# Patient Record
Sex: Female | Born: 1950 | Race: Black or African American | Hispanic: No | Smoking: Never smoker
Health system: Southern US, Community
[De-identification: ages and names within clinical notes are randomized; demographics above are authoritative.]

## PROBLEM LIST (undated history)

## (undated) DIAGNOSIS — I10 Essential (primary) hypertension: Secondary | ICD-10-CM

## (undated) DIAGNOSIS — E785 Hyperlipidemia, unspecified: Secondary | ICD-10-CM

## (undated) DIAGNOSIS — M1990 Unspecified osteoarthritis, unspecified site: Secondary | ICD-10-CM

## (undated) DIAGNOSIS — M5430 Sciatica, unspecified side: Secondary | ICD-10-CM

## (undated) DIAGNOSIS — I519 Heart disease, unspecified: Secondary | ICD-10-CM

## (undated) HISTORY — DX: Heart disease, unspecified: I51.9

## (undated) HISTORY — PX: ANAL FISSURE REPAIR: SHX2312

## (undated) HISTORY — PX: ECTOPIC PREGNANCY SURGERY: SHX613

## (undated) HISTORY — PX: ABDOMINAL HYSTERECTOMY: SHX81

## (undated) HISTORY — PX: BILATERAL CARPAL TUNNEL RELEASE: SHX6508

---

## 1998-09-12 ENCOUNTER — Emergency Department (HOSPITAL_COMMUNITY): Admission: EM | Admit: 1998-09-12 | Discharge: 1998-09-12 | Payer: Self-pay | Admitting: Emergency Medicine

## 2000-04-20 ENCOUNTER — Encounter: Payer: Self-pay | Admitting: Emergency Medicine

## 2000-04-20 ENCOUNTER — Emergency Department (HOSPITAL_COMMUNITY): Admission: EM | Admit: 2000-04-20 | Discharge: 2000-04-20 | Payer: Self-pay | Admitting: Emergency Medicine

## 2000-08-09 ENCOUNTER — Ambulatory Visit (HOSPITAL_COMMUNITY): Admission: RE | Admit: 2000-08-09 | Discharge: 2000-08-09 | Payer: Self-pay | Admitting: Family Medicine

## 2000-08-09 ENCOUNTER — Encounter: Payer: Self-pay | Admitting: Family Medicine

## 2001-02-07 ENCOUNTER — Emergency Department (HOSPITAL_COMMUNITY): Admission: EM | Admit: 2001-02-07 | Discharge: 2001-02-07 | Payer: Self-pay | Admitting: *Deleted

## 2001-07-01 ENCOUNTER — Emergency Department (HOSPITAL_COMMUNITY): Admission: EM | Admit: 2001-07-01 | Discharge: 2001-07-01 | Payer: Self-pay | Admitting: Emergency Medicine

## 2001-11-16 ENCOUNTER — Emergency Department (HOSPITAL_COMMUNITY): Admission: EM | Admit: 2001-11-16 | Discharge: 2001-11-16 | Payer: Self-pay | Admitting: Emergency Medicine

## 2002-02-18 ENCOUNTER — Encounter: Payer: Self-pay | Admitting: Emergency Medicine

## 2002-02-18 ENCOUNTER — Ambulatory Visit (HOSPITAL_COMMUNITY): Admission: RE | Admit: 2002-02-18 | Discharge: 2002-02-18 | Payer: Self-pay | Admitting: Emergency Medicine

## 2002-03-04 ENCOUNTER — Encounter: Admission: RE | Admit: 2002-03-04 | Discharge: 2002-03-04 | Payer: Self-pay | Admitting: Emergency Medicine

## 2002-03-04 ENCOUNTER — Encounter: Payer: Self-pay | Admitting: Emergency Medicine

## 2002-06-25 ENCOUNTER — Encounter: Payer: Self-pay | Admitting: Emergency Medicine

## 2002-06-25 ENCOUNTER — Emergency Department (HOSPITAL_COMMUNITY): Admission: EM | Admit: 2002-06-25 | Discharge: 2002-06-25 | Payer: Self-pay | Admitting: Emergency Medicine

## 2002-08-26 ENCOUNTER — Other Ambulatory Visit: Admission: RE | Admit: 2002-08-26 | Discharge: 2002-08-26 | Payer: Self-pay | Admitting: Obstetrics and Gynecology

## 2002-09-28 ENCOUNTER — Emergency Department (HOSPITAL_COMMUNITY): Admission: EM | Admit: 2002-09-28 | Discharge: 2002-09-28 | Payer: Self-pay | Admitting: Emergency Medicine

## 2002-11-27 HISTORY — PX: CARDIAC CATHETERIZATION: SHX172

## 2002-12-26 ENCOUNTER — Emergency Department (HOSPITAL_COMMUNITY): Admission: EM | Admit: 2002-12-26 | Discharge: 2002-12-26 | Payer: Self-pay | Admitting: Emergency Medicine

## 2003-02-10 ENCOUNTER — Encounter: Payer: Self-pay | Admitting: Emergency Medicine

## 2003-02-11 ENCOUNTER — Inpatient Hospital Stay (HOSPITAL_COMMUNITY): Admission: EM | Admit: 2003-02-11 | Discharge: 2003-02-12 | Payer: Self-pay | Admitting: Cardiology

## 2003-04-22 ENCOUNTER — Emergency Department (HOSPITAL_COMMUNITY): Admission: EM | Admit: 2003-04-22 | Discharge: 2003-04-22 | Payer: Self-pay | Admitting: Emergency Medicine

## 2003-04-23 ENCOUNTER — Emergency Department (HOSPITAL_COMMUNITY): Admission: EM | Admit: 2003-04-23 | Discharge: 2003-04-24 | Payer: Self-pay | Admitting: Emergency Medicine

## 2003-04-24 ENCOUNTER — Encounter: Payer: Self-pay | Admitting: Emergency Medicine

## 2003-11-27 ENCOUNTER — Emergency Department (HOSPITAL_COMMUNITY): Admission: EM | Admit: 2003-11-27 | Discharge: 2003-11-27 | Payer: Self-pay | Admitting: Emergency Medicine

## 2003-11-30 ENCOUNTER — Ambulatory Visit (HOSPITAL_COMMUNITY): Admission: RE | Admit: 2003-11-30 | Discharge: 2003-11-30 | Payer: Self-pay | Admitting: Obstetrics and Gynecology

## 2004-07-18 ENCOUNTER — Encounter: Admission: RE | Admit: 2004-07-18 | Discharge: 2004-07-18 | Payer: Self-pay | Admitting: Family Medicine

## 2004-08-29 ENCOUNTER — Ambulatory Visit: Payer: Self-pay | Admitting: Family Medicine

## 2005-04-25 ENCOUNTER — Emergency Department (HOSPITAL_COMMUNITY): Admission: EM | Admit: 2005-04-25 | Discharge: 2005-04-25 | Payer: Self-pay | Admitting: Emergency Medicine

## 2005-07-02 ENCOUNTER — Encounter (INDEPENDENT_AMBULATORY_CARE_PROVIDER_SITE_OTHER): Payer: Self-pay | Admitting: *Deleted

## 2005-07-10 ENCOUNTER — Emergency Department (HOSPITAL_COMMUNITY): Admission: EM | Admit: 2005-07-10 | Discharge: 2005-07-11 | Payer: Self-pay | Admitting: Emergency Medicine

## 2005-07-17 ENCOUNTER — Ambulatory Visit: Payer: Self-pay | Admitting: Family Medicine

## 2005-10-02 ENCOUNTER — Ambulatory Visit: Payer: Self-pay | Admitting: Family Medicine

## 2006-01-15 ENCOUNTER — Ambulatory Visit: Payer: Self-pay | Admitting: Family Medicine

## 2006-05-14 ENCOUNTER — Ambulatory Visit (HOSPITAL_COMMUNITY): Admission: RE | Admit: 2006-05-14 | Discharge: 2006-05-14 | Payer: Self-pay | Admitting: Family Medicine

## 2006-05-14 ENCOUNTER — Ambulatory Visit: Payer: Self-pay | Admitting: Family Medicine

## 2006-05-17 ENCOUNTER — Ambulatory Visit (HOSPITAL_COMMUNITY): Admission: RE | Admit: 2006-05-17 | Discharge: 2006-05-17 | Payer: Self-pay | Admitting: Family Medicine

## 2006-07-05 ENCOUNTER — Emergency Department (HOSPITAL_COMMUNITY): Admission: EM | Admit: 2006-07-05 | Discharge: 2006-07-05 | Payer: Self-pay | Admitting: Emergency Medicine

## 2006-11-26 ENCOUNTER — Ambulatory Visit: Payer: Self-pay | Admitting: Family Medicine

## 2006-11-29 ENCOUNTER — Ambulatory Visit: Payer: Self-pay | Admitting: *Deleted

## 2006-12-17 ENCOUNTER — Ambulatory Visit: Payer: Self-pay | Admitting: Family Medicine

## 2007-01-24 DIAGNOSIS — I10 Essential (primary) hypertension: Secondary | ICD-10-CM | POA: Insufficient documentation

## 2007-01-24 DIAGNOSIS — G4700 Insomnia, unspecified: Secondary | ICD-10-CM

## 2007-01-24 DIAGNOSIS — I498 Other specified cardiac arrhythmias: Secondary | ICD-10-CM | POA: Insufficient documentation

## 2007-01-24 DIAGNOSIS — I2510 Atherosclerotic heart disease of native coronary artery without angina pectoris: Secondary | ICD-10-CM | POA: Insufficient documentation

## 2007-01-25 ENCOUNTER — Encounter (INDEPENDENT_AMBULATORY_CARE_PROVIDER_SITE_OTHER): Payer: Self-pay | Admitting: *Deleted

## 2007-02-04 ENCOUNTER — Ambulatory Visit: Payer: Self-pay | Admitting: Family Medicine

## 2007-02-15 ENCOUNTER — Inpatient Hospital Stay (HOSPITAL_COMMUNITY): Admission: EM | Admit: 2007-02-15 | Discharge: 2007-02-16 | Payer: Self-pay | Admitting: Emergency Medicine

## 2007-05-09 ENCOUNTER — Emergency Department (HOSPITAL_COMMUNITY): Admission: EM | Admit: 2007-05-09 | Discharge: 2007-05-09 | Payer: Self-pay | Admitting: Emergency Medicine

## 2007-05-15 ENCOUNTER — Ambulatory Visit: Payer: Self-pay | Admitting: Family Medicine

## 2007-07-25 ENCOUNTER — Ambulatory Visit: Payer: Self-pay | Admitting: Internal Medicine

## 2007-08-12 ENCOUNTER — Ambulatory Visit: Payer: Self-pay | Admitting: Internal Medicine

## 2007-08-12 LAB — CONVERTED CEMR LAB
CO2: 26 meq/L (ref 19–32)
Calcium: 9.5 mg/dL (ref 8.4–10.5)
Chloride: 105 meq/L (ref 96–112)
Cholesterol: 230 mg/dL — ABNORMAL HIGH (ref 0–200)
Glucose, Bld: 86 mg/dL (ref 70–99)
Sodium: 142 meq/L (ref 135–145)
Total Bilirubin: 1.1 mg/dL (ref 0.3–1.2)
Total Protein: 7.4 g/dL (ref 6.0–8.3)
Triglycerides: 109 mg/dL (ref <150)
VLDL: 22 mg/dL (ref 0–40)

## 2007-08-14 ENCOUNTER — Encounter (INDEPENDENT_AMBULATORY_CARE_PROVIDER_SITE_OTHER): Payer: Self-pay | Admitting: *Deleted

## 2007-09-18 ENCOUNTER — Ambulatory Visit (HOSPITAL_COMMUNITY): Admission: RE | Admit: 2007-09-18 | Discharge: 2007-09-18 | Payer: Self-pay | Admitting: Internal Medicine

## 2007-12-30 ENCOUNTER — Ambulatory Visit: Payer: Self-pay | Admitting: Internal Medicine

## 2008-02-10 ENCOUNTER — Encounter (INDEPENDENT_AMBULATORY_CARE_PROVIDER_SITE_OTHER): Payer: Self-pay | Admitting: Family Medicine

## 2008-02-10 ENCOUNTER — Ambulatory Visit: Payer: Self-pay | Admitting: Internal Medicine

## 2008-02-10 LAB — CONVERTED CEMR LAB
AST: 15 units/L (ref 0–37)
Albumin: 4.6 g/dL (ref 3.5–5.2)
BUN: 17 mg/dL (ref 6–23)
CO2: 28 meq/L (ref 19–32)
Calcium: 9.9 mg/dL (ref 8.4–10.5)
Chloride: 106 meq/L (ref 96–112)
Creatinine, Ser: 0.87 mg/dL (ref 0.40–1.20)
Glucose, Bld: 97 mg/dL (ref 70–99)

## 2008-05-04 ENCOUNTER — Encounter: Payer: Self-pay | Admitting: Family Medicine

## 2008-05-04 ENCOUNTER — Ambulatory Visit: Payer: Self-pay | Admitting: Internal Medicine

## 2008-05-04 LAB — CONVERTED CEMR LAB
Basophils Absolute: 0 10*3/uL (ref 0.0–0.1)
Basophils Relative: 1 % (ref 0–1)
Cholesterol: 186 mg/dL (ref 0–200)
Eosinophils Absolute: 0.2 10*3/uL (ref 0.0–0.7)
Eosinophils Relative: 3 % (ref 0–5)
HCT: 42.9 % (ref 36.0–46.0)
HDL: 74 mg/dL (ref >39)
Hemoglobin: 13.4 g/dL (ref 12.0–15.0)
LDL Cholesterol: 99 mg/dL (ref 0–99)
Lymphocytes Relative: 39 % (ref 12–46)
Lymphs Abs: 2.3 10*3/uL (ref 0.7–4.0)
MCHC: 31.2 g/dL (ref 30.0–36.0)
MCV: 92.1 fL (ref 78.0–100.0)
Monocytes Absolute: 0.5 10*3/uL (ref 0.1–1.0)
Monocytes Relative: 9 % (ref 3–12)
Neutro Abs: 2.8 10*3/uL (ref 1.7–7.7)
Neutrophils Relative %: 48 % (ref 43–77)
Platelets: 291 10*3/uL (ref 150–400)
RBC: 4.66 M/uL (ref 3.87–5.11)
RDW: 14.1 % (ref 11.5–15.5)
Total CHOL/HDL Ratio: 2.5
Triglycerides: 64 mg/dL (ref <150)
VLDL: 13 mg/dL (ref 0–40)
WBC: 5.8 10*3/uL (ref 4.0–10.5)

## 2008-05-11 ENCOUNTER — Ambulatory Visit: Payer: Self-pay | Admitting: Internal Medicine

## 2008-06-22 ENCOUNTER — Ambulatory Visit: Payer: Self-pay | Admitting: Family Medicine

## 2008-09-21 ENCOUNTER — Ambulatory Visit: Payer: Self-pay | Admitting: Family Medicine

## 2008-09-21 LAB — CONVERTED CEMR LAB
ALT: 16 units/L (ref 0–35)
Alkaline Phosphatase: 60 units/L (ref 39–117)
CO2: 26 meq/L (ref 19–32)
Creatinine, Ser: 0.87 mg/dL (ref 0.40–1.20)
Rhuematoid fact SerPl-aCnc: <20 intl units/mL (ref 0–20)
Sed Rate: 4 mm/hr (ref 0–22)
Sodium: 141 meq/L (ref 135–145)
Total Bilirubin: 0.7 mg/dL (ref 0.3–1.2)
Total Protein: 7.1 g/dL (ref 6.0–8.3)
Uric Acid, Serum: 6.1 mg/dL (ref 2.4–7.0)

## 2008-09-22 ENCOUNTER — Ambulatory Visit (HOSPITAL_COMMUNITY): Admission: RE | Admit: 2008-09-22 | Discharge: 2008-09-22 | Payer: Self-pay | Admitting: Family Medicine

## 2008-09-28 ENCOUNTER — Ambulatory Visit: Payer: Self-pay | Admitting: Family Medicine

## 2009-02-08 ENCOUNTER — Ambulatory Visit: Payer: Self-pay | Admitting: Family Medicine

## 2009-03-04 ENCOUNTER — Emergency Department (HOSPITAL_COMMUNITY): Admission: EM | Admit: 2009-03-04 | Discharge: 2009-03-04 | Payer: Self-pay | Admitting: Emergency Medicine

## 2009-05-24 ENCOUNTER — Ambulatory Visit: Payer: Self-pay | Admitting: Internal Medicine

## 2009-06-23 ENCOUNTER — Ambulatory Visit: Payer: Self-pay | Admitting: Internal Medicine

## 2009-09-27 ENCOUNTER — Ambulatory Visit (HOSPITAL_COMMUNITY): Admission: RE | Admit: 2009-09-27 | Discharge: 2009-09-27 | Payer: Self-pay | Admitting: Family Medicine

## 2009-10-04 ENCOUNTER — Ambulatory Visit: Payer: Self-pay | Admitting: Internal Medicine

## 2009-10-25 ENCOUNTER — Encounter: Admission: RE | Admit: 2009-10-25 | Discharge: 2009-10-25 | Payer: Self-pay | Admitting: Chiropractic Medicine

## 2010-02-21 ENCOUNTER — Ambulatory Visit: Payer: Self-pay | Admitting: Family Medicine

## 2010-03-21 ENCOUNTER — Ambulatory Visit: Payer: Self-pay | Admitting: Internal Medicine

## 2010-03-21 LAB — CONVERTED CEMR LAB
CO2: 26 meq/L (ref 19–32)
Chloride: 105 meq/L (ref 96–112)
Creatinine, Ser: 0.88 mg/dL (ref 0.40–1.20)
HDL: 69 mg/dL (ref >39)
Hgb A1c MFr Bld: 6.1 % — ABNORMAL HIGH (ref <5.7)
LDL Cholesterol: 94 mg/dL (ref 0–99)
Potassium: 4.5 meq/L (ref 3.5–5.3)
Triglycerides: 80 mg/dL (ref <150)
VLDL: 16 mg/dL (ref 0–40)

## 2010-03-28 ENCOUNTER — Ambulatory Visit: Payer: Self-pay | Admitting: Internal Medicine

## 2010-05-23 ENCOUNTER — Ambulatory Visit: Payer: Self-pay | Admitting: Internal Medicine

## 2010-08-11 ENCOUNTER — Emergency Department (HOSPITAL_COMMUNITY): Admission: EM | Admit: 2010-08-11 | Discharge: 2010-08-11 | Payer: Self-pay | Admitting: Emergency Medicine

## 2010-10-03 ENCOUNTER — Encounter: Payer: Self-pay | Admitting: Family Medicine

## 2010-10-03 LAB — CONVERTED CEMR LAB
Basophils Absolute: 0 10*3/uL (ref 0.0–0.1)
Basophils Relative: 1 % (ref 0–1)
Eosinophils Absolute: 0.2 10*3/uL (ref 0.0–0.7)
Eosinophils Relative: 3 % (ref 0–5)
HCT: 44.2 % (ref 36.0–46.0)
Hemoglobin: 14 g/dL (ref 12.0–15.0)
MCHC: 31.7 g/dL (ref 30.0–36.0)
MCV: 92.3 fL (ref 78.0–100.0)
Monocytes Absolute: 0.7 10*3/uL (ref 0.1–1.0)
Platelets: 288 10*3/uL (ref 150–400)
RDW: 13.9 % (ref 11.5–15.5)
Sed Rate: 2 mm/hr (ref 0–22)

## 2010-10-14 ENCOUNTER — Ambulatory Visit (HOSPITAL_COMMUNITY)
Admission: RE | Admit: 2010-10-14 | Discharge: 2010-10-14 | Payer: Self-pay | Source: Home / Self Care | Admitting: Internal Medicine

## 2010-12-05 ENCOUNTER — Ambulatory Visit (HOSPITAL_COMMUNITY)
Admission: RE | Admit: 2010-12-05 | Discharge: 2010-12-05 | Payer: Self-pay | Source: Home / Self Care | Attending: Internal Medicine | Admitting: Internal Medicine

## 2011-04-14 NOTE — H&P (Signed)
NAME:  Krystal Gates, Krystal Gates         ACCOUNT NO.:  [id number]   MEDICAL RECORD NO.:  [id number]          PATIENT TYPE:  EMS   LOCATION:  ED                           FACILITY:  [facility]   PHYSICIAN:  Wilson Singer, M.D.DATE OF BIRTH:  12-12-1950   DATE OF ADMISSION:  02/15/2007  DATE OF DISCHARGE:                              HISTORY & PHYSICAL   HISTORY:  This is a 60 year old lady who woke up this morning at 3 a.m.  when her alarm went off in order to go to work. She went from the  bedside to turn off her alarm and promptly lost consciousness. She  denies any dizziness or light-headedness prior to passing out or any  chest pain or palpitations. The son who lives with her heard her fall  and came up and saw her motionless on the floor. She was not shaking her  limbs. There was no urinary or fecal incontinence. He called the 911  service and by the time they came at about 3:30 in the morning she was  regaining consciousness. She was somewhat confused but now she feels  back to normal. She is completely alert and orientated to time, place  and person. She does have a headache which is typical of  migraine  headaches that she has had in the past.   PAST MEDICAL HISTORY:  1. Hypertension.  2. Hyperlipidemia.  3. She has had a history of chest pain in 2004 when she had a cardiac      catheterization which showed an ejection fraction of 50% with a 30-      40% stenosis at the ostial portion of the left anterior descending      artery but otherwise the cardiac catheterization was normal and      medical management was advised. On this occasion, she was admitted      with chest pain and myocardial infarction was ruled out.   MEDICATIONS:  1. Lisinopril 10/12.5 one tablet daily.  2. Pepcid 20 mg once a day.  3. Pravastatin 20 mg b.i.d.  4. She also takes some sort of hypnotic, the name of which she is      unclear about.   ALLERGIES:  CODEINE, PENICILLIN, TALWIN, TEQUIN, TYLENOL,  CODEINE #3,  VICODIN, ZITHROMAX.   SOCIAL HISTORY:  She is a widow and lives with her son. She does not  smoke and does not drink alcohol. She works at a [employer].   FAMILY HISTORY:  Noncontributory.   REVIEW OF SYSTEMS:  Apart from the symptoms mentioned above, there are  no other symptoms with all systems reviewed.   PHYSICAL EXAMINATION:  VITAL SIGNS:  Interestingly blood pressure is  116/63 lying and 98/66 standing. Temperature 98.4, pulse 60, respiratory  rate 12 per minute, saturation 100% on 2 liters of oxygen.  CARDIOVASCULAR:  Heart sounds are present and normal with no murmurs or  added sounds. There were no carotid bruits. There was no gallop rhythm.  RESPIRATORY:  Lung fields are clear to auscultation.  ABDOMEN:  Soft and nontender with no hepatosplenomegaly.  NEUROLOGIC:  She is alert and oriented to time,  place to person. There  were no focal neurological signs.   LABORATORY DATA:  White blood cell count 5.1, hemoglobin 13.1, platelets  260, sodium 141, potassium 3.8, chloride 105, bicarb 929, glucose 94,  BUN 15, creatinine 0.89.   Electrocardiogram  done in the emergency room shows normal sinus rhythm  with a sinus bradycardia of 44. There were no acute ST-T wave changes.  CT head scan apparently was done and I do not see the full report but  verbally it has been read as apparently normal with no acute changes.   IMPRESSION:  1. Syncope, questionable etiology. She does have some orthostatic      hypotension in the emergency room.  2. History of hypertension.  3. Hyperlipidemia.  4. Remote history of coronary artery disease asymptomatic.   PLAN:  1. We will admit her to a telemetry bed and do serial cardiac enzymes      and electrocardiograms.  2. Intravenous fluids to improve her hypotension possibly on the basis      of hypovolemia.  3. We will get an MRI of the brain. I have told her that if all      investigations turn to be essentially  unremarkable and she feels      better, she should be able to be discharged home within 24 hours      but further recommendations will depend on her hospital progress.      Wilson Singer, M.D.  Electronically Signed     NCG/MEDQ  D:  02/15/2007  T:  02/15/2007  Job:  [id number]   cc:   Willis Modena, MD

## 2011-04-14 NOTE — Consult Note (Signed)
NAMEECHO, PROPP                     ACCOUNT NO.:  [id number]   MEDICAL RECORD NO.:  [id number]                   PATIENT TYPE:  OBV   LOCATION:  0377                                 FACILITY:  [facility]   PHYSICIAN:  Armanda Magic, M.D.                  DATE OF BIRTH:  1951/01/04   DATE OF CONSULTATION:  02/11/2003  DATE OF DISCHARGE:                                   CONSULTATION   CARDIOLOGY CONSULTATION:   CHIEF COMPLAINT:  Chest pain.   HISTORY OF PRESENT ILLNESS:  The patient is a 60 year old black female with  no previous cardiac history who was admitted with chest pain.  She was at  church practicing dance moves yesterday when she developed sternal chest  pain over her left chest lasting approximately 2 hours and 10/10 in  intensity.  The patient became profoundly diaphoretic, short of breath and  dizzy.  She had presyncope, according to the patient, although she says her  family says that she went completely out for a few seconds.  There was no  radiation of her chest pain.  The pain resolved in the emergency room.  This  morning she had recurrent chest pain when up moving around the room,  currently pain-free.   PAST MEDICAL HISTORY:  Borderline hypertension.   ALLERGIES:  PENICILLIN, CODEINE, TYLOX, TALWIN, ZITHROMAX.   PAST SURGICAL HISTORY:  Ectopic tubal pregnancy, total abdominal  hysterectomy, rectal surgery for fissure and carpal tunnel surgery release.   SOCIAL HISTORY:  She is widowed and she is adopted.  She has three children.  She denies tobacco or alcohol use.   FAMILY HISTORY:  Her father and her mother are both deceased, again the  patient is adopted.   REVIEW OF SYSTEMS:  She had a headache yesterday but otherwise review of  systems is negative except what is stated in HPI.   PHYSICAL EXAMINATION:  VITAL SIGNS:  Blood pressure is 144/76, heart rate  52, she is afebrile.  GENERAL:  Well-developed, well-nourished black female in no acute  distress.  HEENT:  Benign.  NECK:  Supple without lymphadenopathy; no bruits.  LUNGS:  Clear to auscultation throughout.  HEART:  Regular rate and rhythm; no murmurs or acute gallops; normal S1 and  S2.  ABDOMEN:  Soft, nontender, nondistended with active bowel sounds; no  hepatosplenomegaly.  EXTREMITIES:  No cyanosis, erythema, or edema.   LABORATORIES:  Sodium 140, potassium 3.5, chloride 109, bicarb 27, BUN 12,  creatinine 0.9, glucose 95.  White cell count 6.7, hematocrit 37.8,  hemoglobin 12.6, platelet count 278.  INR 1.1.  CPK is 108 and 76, MB is 1.8  and 1.4, troponin less than 0.01 x2.  EKG shows normal sinus rhythm, no  ischemia.    ASSESSMENT:  1. Unstable angina pectoris.  The patient did rule out for myocardial     infarction and the EKG shows no ischemia.  The patient had  recurrent     chest pain this morning.  2. Borderline hypertension.   PLAN:  Cardiac catheterization later today by Dr. Katrinka Blazing, clear liquids now  then n.p.o., check fasting lipid panel, increase beta blockers as needed for  hypertension and give K-Dur 40 mEq now for a potassium of 3.5.  Risks and  benefits of cardiac catheterization were explained including the risk of MI,  CVA, death, bleeding complications, IV contrast allergy, renal failure,  pseudoaneurysm, AV fistula.  The patient understands and wishes to proceed.                                               Armanda Magic, M.D.    TT/MEDQ  D:  02/11/2003  T:  02/11/2003  Job:  [id number]   cc:   Lazaro Arms, M.D.  [address] [place], [place] [address]  Fax: [phone]   Bryan Lemma. Manus Gunning, M.D.  [address] [place]  [place] [address]  Fax: [phone]

## 2011-04-14 NOTE — Cardiovascular Report (Signed)
Krystal Gates, Krystal Gates                     ACCOUNT NO.:  [id number]   MEDICAL RECORD NO.:  [id number]                   PATIENT TYPE:  INP   LOCATION:  2023                                 FACILITY:  [facility]   PHYSICIAN:  Lesleigh Noe, M.D.            DATE OF BIRTH:  Aug 04, 1951   DATE OF PROCEDURE:  02/12/2003  DATE OF DISCHARGE:  02/12/2003                              CARDIAC CATHETERIZATION   PROCEDURES:  1. Left heart catheterization.  2. Selective coronary angiography.  3. Left ventriculography.   INDICATIONS:  Chest pain consistent with ischemic pain, negative enzymes.  The patient has risk factors of coronary disease including hyperlipidemia  and hypertension.   DESCRIPTION OF PROCEDURE:  After informed consent, a 6 French sheath was  placed in the right femoral artery using modified Seldinger technique.  A 6  French pigtail multipurpose catheter was used for hemodynamic purposes, left  ventriculography, and attempted coronary angiography.  A #4 6 French right  and #4 6 French left catheter were also used for coronary angiography.  The  patient tolerated the procedure without complications.   RESULTS:  1. HEMODYNAMIC DATA:     a. Aortic pressure 151/82.     b. Left ventricular pressure 161/15.   1. CORONARY ANGIOGRAPHY:     a. Left main:  Normal.     b. LAD:  The LAD contains an ostial eccentric 30-50% narrowing.  LAD is        otherwise normal.  The LAD gives origin to two diagonal branches.        There is moderate tortuosity in the LAD.  No obstruction is felt to be        present.     c. Circumflex artery:  The circumflex coronary artery is large.  It gives        origin to a trifurcating dominant obtuse marginal branch.  No        significant obstruction is noted.     d. Right coronary:  The right coronary artery is free of any significant        obstruction.  PDA and left ventricular branch are noted.   CONCLUSION:  1. Mild to moderate ostial left  anterior descending artery eccentric plaque,     obstruction of the artery by no more than 50%.  Coronary angiography is     otherwise unremarkable.  2. Normal left ventricular function.    PLAN:  Aggressive risk factor modification, including blood pressure  control, therapy of lipids, and antiplatelet therapy.  If recurrent chest  discomfort, consider myocardial perfusion study or intravascular ultrasound  to get additional information concerning the LAD lesion.                                               Lyn Records  III, M.D.    HWS/MEDQ  D:  02/12/2003  T:  02/14/2003  Job:  [id number]   cc:   Armanda Magic, M.D.  [address]  [place], [place] [address]  Fax: [phone]   Bryan Lemma. Manus Gunning, M.D.  [address] [place]  [place] [address]  Fax: [phone]

## 2011-04-14 NOTE — Discharge Summary (Signed)
Krystal Gates, Krystal Gates                     ACCOUNT NO.:  [id number]   Krystal RECORD NO.:  [id number]                   PATIENT TYPE:  INP   LOCATION:  2023                                 FACILITY:  [facility]   PHYSICIAN:  Krystal Gates, M.D.                  DATE OF BIRTH:  11/13/1951   DATE OF ADMISSION:  02/11/2003  DATE OF DISCHARGE:  02/12/2003                                 DISCHARGE SUMMARY   ADMISSION DIAGNOSES:  1. Chest pain, rule out myocardial infarction.  2. Borderline elevated blood pressure.  3. Hormone replacement therapy.  4. Remote tobacco use.  5. Unknown cholesterol status.   DISCHARGE DIAGNOSES:  1. Chest pain, resolved.  Myocardial infarction ruled out with negative     enzymes.  2. Cardiac catheterization revealing non-critical coronary artery disease.     Aggressive Krystal therapy recommended.  3. Hypertension - Krystal therapy initiated.  4. Hormone replacement therapy.  5. Remote tobacco use.  6. Unknown cholesterol status.   HISTORY OF PRESENT ILLNESS:  [name] is a 60 year old African-  American female patient of Dr. Serafina Gates.  She was admitted on 02/10/03 with  complaints of chest pain lasting 2-3 hours.  Apparently she was dancing when  she started to have chest pain associated with mild shortness of breath,  nausea, diaphoresis, and dizziness.  She thought there was something heavy  on her chest.  It was nonradiating.  In the emergency room, she was treated  with two sublingual nitroglycerin, then IV morphine, which abated the pain  completely.  She denies prior episodes of chest pain.  There is a history of  borderline elevated blood pressure and remote tobacco use, but no family  history of premature heart disease, and lipid status is unknown.  The  patient is admitted with chest pain, rule out myocardial infarction.  Serial  cardiac enzymes will be followed.  Admission EKG shows no acute ischemic  changes.  The first cardiac enzymes  are negative.  Will continue IV heparin,  aspirin, and beta blocker therapy.  If enzymes are negative, will proceed  with stress testing.  If they are positive, will proceed with cardiac  catheterization unless decision is made to do otherwise.   PROCEDURES:  Cardiac catheterization 02/12/03 by Dr. Verdis Gates.   COMPLICATIONS:  None.   COURSE IN Gates:  [name] was admitted to [facility]  on 02/10/03 by Dr. Halford Gates.  Admitted for chest pain, rule out myocardial  infarction.  Admission EKG was unremarkable, and the first cardiac enzymes  were negative.  The patient was treated with IV heparin and beta blockers.  Because of recurrent chest pain overnight, a cardiology consult was called  to Dr. Mayford Gates.  While cardiac enzymes were negative, the patient's symptoms  were worrisome for unstable angina, and, because of persistent symptoms,  cardiac catheterization was recommended.   Initially the cath was scheduled for 02/11/03.  However, because of an  emergency procedure in the cath lab, this had to be postponed until 02/12/03.   Cardiac catheterization was performed 02/12/03 by Dr. Katrinka Gates.  This revealed  left ventriculogram normal, EF 50%.  Coronary angiography revealed a left  main that was normal, LAD with an osteal 30-40% eccentric lesion, circumflex  normal, and RCA-LI.   Dr. Katrinka Gates felt the patient had moderate osteal LAD disease and RCA-LI.  Normal LV systolic function.  He recommended aggressive Krystal therapy  including aspirin, statin, and blood pressure control.  Recommended clinical  follow up with Dr. Mayford Gates.   The patient tolerated the cardiac catheterization well.  There were no  problems with feet.  Groin remained stable.   The patient remained chest pain free and was felt stable for discharge to  home following bed rest after cath.   Of note, because cardiac catheterization was performed and the patient was  transferred to [facility],  Dr. Mayford Gates took her on her service.  Admission labs showed a WBC of 6.7, hemoglobin 12.6, and platelets 298.  Potassium 3.5, BUN 12, creatinine 0.9.  The potassium was repleted prior to  catheterization.   DISCHARGE MEDICATIONS:  1. Enteric-coated aspirin 81 mg daily.  2. Toprol XL 50 mg daily.  3. Premarin 0.25 mg daily.   ACTIVITY:  No heavy lifting, pushing, pulling, driving today or tomorrow,  then activity as before.  May shower.   DIET:  Low-fat, low-cholesterol, low-salt diet.   DISCHARGE INSTRUCTIONS:  She is asked to call Dr. Norris Gates office for any  problems or questions.   FOLLOW UP:  1. Follow up appointment has been scheduled with Dr. Mayford Gates for Tuesday,     02/24/03, at 10:15 in the morning.  She will also have a fasting lipid     profile and LFTs drawn at that appointment.  Based on those results,     statin therapy will be initiated by Dr. Mayford Gates.  2. She should follow up with Dr. Serafina Gates as previously scheduled or next     available.     Krystal Gates, P.A.                   Krystal Gates, M.D.    TCJ/MEDQ  D:  02/12/2003  T:  02/14/2003  Job:  [id number]   cc:   Krystal Gates, M.D.  [address]  [place], [place] [address]  Fax: [phone]   Dr. Bethann Gates(?)

## 2011-09-14 LAB — POCT I-STAT CREATININE: Creatinine, Ser: 0.7

## 2011-09-14 LAB — URINALYSIS, ROUTINE W REFLEX MICROSCOPIC
Ketones, ur: NEGATIVE
Nitrite: NEGATIVE
pH: 7

## 2011-09-14 LAB — I-STAT 8, (EC8 V) (CONVERTED LAB)
BUN: 8
Bicarbonate: 28.7 — ABNORMAL HIGH
Glucose, Bld: 95
Hemoglobin: 16 — ABNORMAL HIGH
Sodium: 139
pCO2, Ven: 57.8 — ABNORMAL HIGH
pH, Ven: 7.304 — ABNORMAL HIGH

## 2011-10-17 ENCOUNTER — Other Ambulatory Visit (HOSPITAL_COMMUNITY): Payer: Self-pay | Admitting: Family Medicine

## 2011-10-17 DIAGNOSIS — Z1231 Encounter for screening mammogram for malignant neoplasm of breast: Secondary | ICD-10-CM

## 2011-11-16 ENCOUNTER — Ambulatory Visit (HOSPITAL_COMMUNITY): Payer: Self-pay

## 2011-12-20 ENCOUNTER — Ambulatory Visit (HOSPITAL_COMMUNITY)
Admission: RE | Admit: 2011-12-20 | Discharge: 2011-12-20 | Disposition: A | Discharge status: Home / Self Care | Payer: Self-pay | Source: Ambulatory Visit | Attending: Family Medicine | Admitting: Family Medicine

## 2011-12-20 DIAGNOSIS — Z1231 Encounter for screening mammogram for malignant neoplasm of breast: Secondary | ICD-10-CM | POA: Insufficient documentation

## 2012-12-16 ENCOUNTER — Ambulatory Visit (HOSPITAL_COMMUNITY)
Admission: RE | Admit: 2012-12-16 | Discharge: 2012-12-16 | Disposition: A | Discharge status: Home / Self Care | Payer: Self-pay | Source: Ambulatory Visit | Attending: Internal Medicine | Admitting: Internal Medicine

## 2012-12-16 ENCOUNTER — Other Ambulatory Visit (HOSPITAL_COMMUNITY): Payer: Self-pay | Admitting: Internal Medicine

## 2012-12-16 DIAGNOSIS — M25559 Pain in unspecified hip: Secondary | ICD-10-CM | POA: Insufficient documentation

## 2012-12-16 DIAGNOSIS — M1610 Unilateral primary osteoarthritis, unspecified hip: Secondary | ICD-10-CM

## 2013-03-14 ENCOUNTER — Emergency Department (HOSPITAL_COMMUNITY)
Admission: EM | Admit: 2013-03-14 | Discharge: 2013-03-14 | Disposition: A | Discharge status: Home / Self Care | Payer: Self-pay | Attending: Emergency Medicine | Admitting: Emergency Medicine

## 2013-03-14 ENCOUNTER — Encounter (HOSPITAL_COMMUNITY): Payer: Self-pay | Admitting: Emergency Medicine

## 2013-03-14 DIAGNOSIS — R112 Nausea with vomiting, unspecified: Secondary | ICD-10-CM | POA: Insufficient documentation

## 2013-03-14 DIAGNOSIS — Z79899 Other long term (current) drug therapy: Secondary | ICD-10-CM | POA: Insufficient documentation

## 2013-03-14 DIAGNOSIS — M545 Low back pain, unspecified: Secondary | ICD-10-CM | POA: Insufficient documentation

## 2013-03-14 DIAGNOSIS — E785 Hyperlipidemia, unspecified: Secondary | ICD-10-CM | POA: Insufficient documentation

## 2013-03-14 DIAGNOSIS — R197 Diarrhea, unspecified: Secondary | ICD-10-CM | POA: Insufficient documentation

## 2013-03-14 DIAGNOSIS — Z7982 Long term (current) use of aspirin: Secondary | ICD-10-CM | POA: Insufficient documentation

## 2013-03-14 DIAGNOSIS — M79609 Pain in unspecified limb: Secondary | ICD-10-CM | POA: Insufficient documentation

## 2013-03-14 DIAGNOSIS — Z8739 Personal history of other diseases of the musculoskeletal system and connective tissue: Secondary | ICD-10-CM | POA: Insufficient documentation

## 2013-03-14 DIAGNOSIS — I10 Essential (primary) hypertension: Secondary | ICD-10-CM | POA: Insufficient documentation

## 2013-03-14 DIAGNOSIS — M549 Dorsalgia, unspecified: Secondary | ICD-10-CM

## 2013-03-14 HISTORY — DX: Essential (primary) hypertension: I10

## 2013-03-14 HISTORY — DX: Unspecified osteoarthritis, unspecified site: M19.90

## 2013-03-14 HISTORY — DX: Hyperlipidemia, unspecified: E78.5

## 2013-03-14 LAB — POCT I-STAT, CHEM 8
BUN: 15 mg/dL (ref 6–23)
Creatinine, Ser: 0.9 mg/dL (ref 0.50–1.10)
Potassium: 3.9 mEq/L (ref 3.5–5.1)
Sodium: 138 mEq/L (ref 135–145)

## 2013-03-14 MED ORDER — ONDANSETRON HCL 4 MG PO TABS: 4.0000 mg | ORAL_TABLET | Freq: Four times a day (QID) | ORAL | Status: DC

## 2013-03-14 MED ORDER — ONDANSETRON 8 MG PO TBDP
8.0000 mg | ORAL_TABLET | Freq: Once | ORAL | Status: AC
  Administered 2013-03-14: 8 mg via ORAL
  Filled 2013-03-14: qty 1

## 2013-03-14 MED ORDER — METHOCARBAMOL 500 MG PO TABS: 500.0000 mg | ORAL_TABLET | Freq: Two times a day (BID) | ORAL | Status: DC

## 2013-03-14 NOTE — ED Notes (Signed)
Patient c/o vomiting & diarrhea that started @0130  this am. Patient state she has had 5 episodes of emesis and diarrhea, patient states they happen at the same time. Patient reports she is also having pain in her legs, sometimes both legs at the same time, sometimes it is intermittent. Pain in legs has been ongoing >1 year, Patient states she has seen her PCP (Dr Concepcion Elk) for this and he has prescribed a variety of medications, none have worked.

## 2013-03-14 NOTE — ED Provider Notes (Signed)
History     CSN: [id number]  Arrival date & time 03/14/13  0603   First MD Initiated Contact with Patient 03/14/13 [phone]      Chief Complaint  Patient presents with  . Emesis  . Diarrhea  . Leg Pain    (Consider location/radiation/quality/duration/timing/severity/associated sxs/prior treatment) HPI Comments: Patient presents to the ED with a chief complaint of n/v/d.  Patient states that the symptoms began last night.  She denies having any bloody or bilious vomit or diarrhea.  Nothing makes her symptoms better or worse.  Additionally, patient states that she has had right-sided low back pain and radiating symptoms to her right leg x 1 year.  She has tried taking steroids and lyrica, which has helped a little.  Patient states that the pain is moderate.  The history is provided by the patient. No language interpreter was used.    Past Medical History  Diagnosis Date  . Hypertension   . Arthritis   . Hyperlipidemia     Past Surgical History  Procedure Laterality Date  . Rectal fissure    . Carpel tunnel Bilateral   . Abdominal hysterectomy      History reviewed. No pertinent family history.  History  Substance Use Topics  . Smoking status: Never Smoker   . Smokeless tobacco: Not on file  . Alcohol Use: No    OB History   Grav Para Term Preterm Abortions TAB SAB Ect Mult Living                  Review of Systems  All other systems reviewed and are negative.    Allergies  Codeine; Penicillins; Talwin; and Zithromax  Home Medications   Current Outpatient Rx  Name  Route  Sig  Dispense  Refill  . aspirin EC 81 MG tablet   Oral   Take 81 mg by mouth every morning.         . diclofenac (VOLTAREN) 75 MG EC tablet   Oral   Take 75 mg by mouth 2 (two) times daily.         . pravastatin (PRAVACHOL) 40 MG tablet   Oral   Take 40 mg by mouth every evening.         . traZODone (DESYREL) 100 MG tablet   Oral   Take 100 mg by mouth at bedtime.          . potassium chloride (K-DUR) 10 MEQ tablet   Oral   Take 10 mEq by mouth every morning.           BP 99/55  Pulse 72  Temp(Src) 98.7 F (37.1 C) (Oral)  Resp 20  SpO2 98%  Physical Exam  Nursing note and vitals reviewed. Constitutional: She is oriented to person, place, and time. She appears well-developed and well-nourished. No distress.  HENT:  Head: Normocephalic and atraumatic.  Eyes: Conjunctivae and EOM are normal. Right eye exhibits no discharge. Left eye exhibits no discharge. No scleral icterus.  Neck: Normal range of motion. Neck supple. No tracheal deviation present.  Cardiovascular: Normal rate, regular rhythm and normal heart sounds.  Exam reveals no gallop and no friction rub.   No murmur heard. Pulmonary/Chest: Effort normal and breath sounds normal. No respiratory distress. She has no wheezes.  Abdominal: Soft. Bowel sounds are normal. She exhibits no distension. There is no tenderness.  No focal abdominal tenderness, no signs of acute or surgical abdomen  Musculoskeletal: Normal range of motion.  Lumbar paraspinal  muscles tender to palpation, no bony tenderness, step-offs, or gross abnormality or deformity of spine, patient is able to ambulate, moves all extremities  Neurological: She is alert and oriented to person, place, and time.  Sensation and strength intact bilaterally  Skin: Skin is warm. She is not diaphoretic.  Psychiatric: She has a normal mood and affect. Her behavior is normal. Judgment and thought content normal.    ED Course  Procedures (including critical care time)  Results for orders placed during the hospital encounter of 03/14/13  POCT I-STAT, CHEM 8      Result Value Range   Sodium 138  135 - 145 mEq/L   Potassium 3.9  3.5 - 5.1 mEq/L   Chloride 102  96 - 112 mEq/L   BUN 15  6 - 23 mg/dL   Creatinine, Ser 1.61  0.50 - 1.10 mg/dL   Glucose, Bld 096 (*) 70 - 99 mg/dL   Calcium, Ion 0.45  4.09 - 1.30 mmol/L   TCO2 28  0 - 100  mmol/L   Hemoglobin 15.3 (*) 12.0 - 15.0 g/dL   HCT 81.1  91.4 - 78.2 %      1. Nausea vomiting and diarrhea   2. Back pain       MDM  Patient with n/v/d. VSS.  Will check chem 8.  Zofran and fluid challenge.  Patient also states that she has had back pain for the past year.  Discussed this in detail with the patient.  Recommended PT or outpatient orthopedics.  Patient with back pain.  No neurological deficits and normal neuro exam.  Patient can walk but states is painful.  No loss of bowel or bladder control.  No concern for cauda equina.  No fever, night sweats, weight loss, h/o cancer, IVDU.  RICE protocol and pain medicine indicated and discussed with patient.    Patient tolerating by mouth fluids. Will discharge with some Zofran. Suspect viral gastroenteritis, which is been very prevalent in the community. Labs are reassuring. Will have the patient followup with orthopedics for her back pain.       [name], PA-C 03/14/13 1339

## 2013-03-17 NOTE — ED Provider Notes (Signed)
Medical screening examination/treatment/procedure(s) were performed by non-physician practitioner and as supervising physician I was immediately available for consultation/collaboration.   Lyanne Co, MD 03/17/13 [phone]

## 2013-04-15 ENCOUNTER — Emergency Department (HOSPITAL_COMMUNITY)
Admission: EM | Admit: 2013-04-15 | Discharge: 2013-04-15 | Disposition: A | Discharge status: Home / Self Care | Payer: No Typology Code available for payment source | Attending: Emergency Medicine | Admitting: Emergency Medicine

## 2013-04-15 ENCOUNTER — Encounter (HOSPITAL_COMMUNITY): Payer: Self-pay | Admitting: Emergency Medicine

## 2013-04-15 DIAGNOSIS — Z7982 Long term (current) use of aspirin: Secondary | ICD-10-CM | POA: Insufficient documentation

## 2013-04-15 DIAGNOSIS — I10 Essential (primary) hypertension: Secondary | ICD-10-CM | POA: Insufficient documentation

## 2013-04-15 DIAGNOSIS — M129 Arthropathy, unspecified: Secondary | ICD-10-CM | POA: Insufficient documentation

## 2013-04-15 DIAGNOSIS — Z79899 Other long term (current) drug therapy: Secondary | ICD-10-CM | POA: Insufficient documentation

## 2013-04-15 DIAGNOSIS — M5430 Sciatica, unspecified side: Secondary | ICD-10-CM | POA: Insufficient documentation

## 2013-04-15 DIAGNOSIS — M5432 Sciatica, left side: Secondary | ICD-10-CM

## 2013-04-15 DIAGNOSIS — Z880 Allergy status to penicillin: Secondary | ICD-10-CM | POA: Insufficient documentation

## 2013-04-15 DIAGNOSIS — E785 Hyperlipidemia, unspecified: Secondary | ICD-10-CM | POA: Insufficient documentation

## 2013-04-15 HISTORY — DX: Sciatica, unspecified side: M54.30

## 2013-04-15 MED ORDER — OXYCODONE-ACETAMINOPHEN 5-325 MG PO TABS
1.0000 | ORAL_TABLET | Freq: Once | ORAL | Status: AC
  Administered 2013-04-15: 1 via ORAL
  Filled 2013-04-15: qty 1

## 2013-04-15 MED ORDER — OXYCODONE-ACETAMINOPHEN 5-325 MG PO TABS: 2.0000 | ORAL_TABLET | ORAL | Status: DC | PRN

## 2013-04-15 MED ORDER — OXYCODONE-ACETAMINOPHEN 5-325 MG PO TABS
2.0000 | ORAL_TABLET | Freq: Once | ORAL | Status: AC
  Administered 2013-04-15: 1 via ORAL
  Filled 2013-04-15: qty 1

## 2013-04-15 MED ORDER — PREDNISONE 20 MG PO TABS
60.0000 mg | ORAL_TABLET | Freq: Once | ORAL | Status: AC
  Administered 2013-04-15: 60 mg via ORAL
  Filled 2013-04-15: qty 3

## 2013-04-15 MED ORDER — PREDNISONE 10 MG PO TABS: 20.0000 mg | ORAL_TABLET | Freq: Every day | ORAL | Status: DC

## 2013-04-15 MED ORDER — CYCLOBENZAPRINE HCL 10 MG PO TABS: 10.0000 mg | ORAL_TABLET | Freq: Two times a day (BID) | ORAL | Status: AC | PRN

## 2013-04-15 MED ORDER — CYCLOBENZAPRINE HCL 10 MG PO TABS
5.0000 mg | ORAL_TABLET | Freq: Once | ORAL | Status: AC
  Administered 2013-04-15: 5 mg via ORAL
  Filled 2013-04-15: qty 1

## 2013-04-15 NOTE — ED Notes (Signed)
Pt requesting other Percocet for pain. PA aware.

## 2013-04-15 NOTE — ED Notes (Signed)
Pt states that she has known sciatica and it started hurting her today around 0530.  States that her left hip and both legs hurt.

## 2013-04-15 NOTE — ED Notes (Signed)
Pt able to ambulate slowly in hall. Pt states her pain is better and she feels sleepy. States she is still having muscle spasms. Pt assisted back to wheelchair and helped back to room. PA Laveda Norman notified.

## 2013-04-15 NOTE — ED Provider Notes (Signed)
History    This chart was scribed for non-physician practitioner, Fayrene Helper PA-C working with Celene Kras, MD by Donne Anon, ED Scribe. This patient was seen in room WTR5/WTR5 and the patient's care was started at 1751.   CSN: [id number]  Arrival date & time 04/15/13  1639   First MD Initiated Contact with Patient 04/15/13 1751      Chief Complaint  Patient presents with  . Back Pain  . Sciatica     The history is provided by the patient. No language interpreter was used.   HPI Comments: Krystal Gates is a 62 y.o. female who presents to the Emergency Department complaining of gradual onset, gradually worsening, constant, chronic (1 year) sciatica pain that radiates down the back of her legs into her ankles and became exacerbated this morning. She describes it as a throbbing and twisting sensation. She states the pain is worse in the right leg. She denies any specific movement that exacerbated the pain this morning. She denies incontinence, abdominal pain, neck pain, mid back pain, numbness, tinging, rash, fever, dysuria, hematouria, or any other pain. She states stretching makes the pain better and walking and leg movement makes the pain worse. She states she has tried muscle relaxer with no relief. She was seen in the ED a month ago for the same pain.   Past Medical History  Diagnosis Date  . Hypertension   . Arthritis   . Hyperlipidemia   . Sciatica     Past Surgical History  Procedure Laterality Date  . Rectal fissure    . Carpel tunnel Bilateral   . Abdominal hysterectomy      No family history on file.  History  Substance Use Topics  . Smoking status: Never Smoker   . Smokeless tobacco: Not on file  . Alcohol Use: No     Review of Systems  Musculoskeletal: Positive for back pain.  All other systems reviewed and are negative.    Allergies  Codeine; Penicillins; Talwin; and Zithromax  Home Medications   Current Outpatient Rx  Name  Route  Sig   Dispense  Refill  . aspirin EC 81 MG tablet   Oral   Take 81 mg by mouth every morning.         . diclofenac (VOLTAREN) 75 MG EC tablet   Oral   Take 75 mg by mouth 2 (two) times daily.         . methocarbamol (ROBAXIN) 500 MG tablet   Oral   Take 1 tablet (500 mg total) by mouth 2 (two) times daily.   20 tablet   0   . ondansetron (ZOFRAN) 4 MG tablet   Oral   Take 1 tablet (4 mg total) by mouth every 6 (six) hours.   12 tablet   0   . potassium chloride (K-DUR) 10 MEQ tablet   Oral   Take 10 mEq by mouth every morning.         . pravastatin (PRAVACHOL) 40 MG tablet   Oral   Take 40 mg by mouth every evening.         . traZODone (DESYREL) 100 MG tablet   Oral   Take 100 mg by mouth at bedtime.           BP 117/58  Temp(Src) 98.5 F (36.9 C) (Oral)  Resp 16  SpO2 100%  Physical Exam  Nursing note and vitals reviewed. Constitutional: She is oriented to person, place, and time. She  appears well-developed and well-nourished. No distress.  HENT:  Head: Normocephalic and atraumatic.  Eyes: EOM are normal.  Neck: Neck supple. No tracheal deviation present.  Cardiovascular: Normal rate.   Pulmonary/Chest: Effort normal. No respiratory distress.  Abdominal: Soft. She exhibits no distension.  Genitourinary:  No cva tenderness  Musculoskeletal: Normal range of motion.  Paralumbar tenderness to palpation with no significant midline spine tenderness, crepitance or step offs. Positive straight leg raise left greater than right.   Neurological: She is alert and oriented to person, place, and time.  Skin: Skin is warm and dry.  Psychiatric: She has a normal mood and affect. Her behavior is normal.    ED Course  Procedures (including critical care time) DIAGNOSTIC STUDIES: Oxygen Saturation is 100% on room air, normal by my interpretation.    COORDINATION OF CARE: 6:09 PM Discussed treatment plan which includes pain medication, muscle relaxants, and a  steroid with pt at bedside and pt agreed to plan. Advised pt to rest for 4 days and then begin gentle stretching. Return precautions advised. Will give a note for work. Therapy discussed. Referral given.  7:29 PM Pt able to ambulate.  No red flags.  Stable for discharge.  Ortho referral given.  Return precaution discussed.  Work note given.    Labs Reviewed - No data to display No results found.   1. Sciatica, left       MDM  BP 117/58  Temp(Src) 98.5 F (36.9 C) (Oral)  Resp 16  SpO2 100%    I personally performed the services described in this documentation, which was scribed in my presence. The recorded information has been reviewed and is accurate.        Fayrene Helper, PA-C 04/15/13 1933

## 2013-04-15 NOTE — ED Notes (Signed)
Pt to exam room in wheelchair. Pt assisted to gurney by family members. Pt states she had a hard time getting out of her car today after work, due to her sciatica pain. Pt states she has bilateral leg cramping which is worse in the L hip area.

## 2013-04-16 NOTE — ED Provider Notes (Signed)
Medical screening examination/treatment/procedure(s) were performed by non-physician practitioner and as supervising physician I was immediately available for consultation/collaboration.    Celene Kras, MD 04/16/13 [phone]

## 2013-08-27 ENCOUNTER — Other Ambulatory Visit (HOSPITAL_COMMUNITY): Payer: Self-pay | Admitting: Internal Medicine

## 2013-08-27 DIAGNOSIS — Z1231 Encounter for screening mammogram for malignant neoplasm of breast: Secondary | ICD-10-CM

## 2013-09-01 ENCOUNTER — Ambulatory Visit (HOSPITAL_COMMUNITY)
Admission: RE | Admit: 2013-09-01 | Discharge: 2013-09-01 | Disposition: A | Discharge status: Home / Self Care | Payer: No Typology Code available for payment source | Source: Ambulatory Visit | Attending: Internal Medicine | Admitting: Internal Medicine

## 2013-09-01 DIAGNOSIS — Z1231 Encounter for screening mammogram for malignant neoplasm of breast: Secondary | ICD-10-CM | POA: Insufficient documentation

## 2013-12-24 ENCOUNTER — Other Ambulatory Visit: Payer: Self-pay | Admitting: Neurosurgery

## 2013-12-24 DIAGNOSIS — M48062 Spinal stenosis, lumbar region with neurogenic claudication: Secondary | ICD-10-CM

## 2014-02-05 ENCOUNTER — Encounter: Payer: Self-pay | Admitting: Gastroenterology

## 2014-03-23 ENCOUNTER — Ambulatory Visit (AMBULATORY_SURGERY_CENTER): Payer: Self-pay | Admitting: *Deleted

## 2014-03-23 VITALS — Ht 63.0 in | Wt 177.2 lb

## 2014-03-23 DIAGNOSIS — Z1211 Encounter for screening for malignant neoplasm of colon: Secondary | ICD-10-CM

## 2014-03-23 MED ORDER — NA SULFATE-K SULFATE-MG SULF 17.5-3.13-1.6 GM/177ML PO SOLN: ORAL | Status: DC

## 2014-03-23 NOTE — Progress Notes (Signed)
Patient denies any allergies to eggs or soy. Patient denies any problems with anesthesia. No oxygen use, no diet/wt loss pills per patient. EMMI instructions given to patient on colonoscopy.

## 2014-04-02 ENCOUNTER — Ambulatory Visit (AMBULATORY_SURGERY_CENTER): Payer: No Typology Code available for payment source | Admitting: Gastroenterology

## 2014-04-02 ENCOUNTER — Encounter: Payer: Self-pay | Admitting: Gastroenterology

## 2014-04-02 VITALS — BP 122/77 | HR 78 | Temp 98.1°F | Resp 22 | Ht 63.0 in | Wt 177.0 lb

## 2014-04-02 DIAGNOSIS — D126 Benign neoplasm of colon, unspecified: Secondary | ICD-10-CM

## 2014-04-02 DIAGNOSIS — Z1211 Encounter for screening for malignant neoplasm of colon: Secondary | ICD-10-CM

## 2014-04-02 MED ORDER — SODIUM CHLORIDE 0.9 % IV SOLN: 500.0000 mL | INTRAVENOUS | Status: DC

## 2014-04-02 NOTE — Progress Notes (Signed)
Called to room to assist during endoscopic procedure.  Patient ID and intended procedure confirmed with present staff. Received instructions for my participation in the procedure from the performing physician.  

## 2014-04-02 NOTE — Progress Notes (Signed)
Report to pacu rn, vss. Bbs=clear.

## 2014-04-02 NOTE — Patient Instructions (Signed)
YOU HAD AN ENDOSCOPIC PROCEDURE TODAY AT THE [facility]: Refer to the procedure report that was given to you for any specific questions about what was found during the examination.  If the procedure report does not answer your questions, please call your gastroenterologist to clarify.  If you requested that your care partner not be given the details of your procedure findings, then the procedure report has been included in a sealed envelope for you to review at your convenience later.  YOU SHOULD EXPECT: Some feelings of bloating in the abdomen. Passage of more gas than usual.  Walking can help get rid of the air that was put into your GI tract during the procedure and reduce the bloating. If you had a lower endoscopy (such as a colonoscopy or flexible sigmoidoscopy) you may notice spotting of blood in your stool or on the toilet paper. If you underwent a bowel prep for your procedure, then you may not have a normal bowel movement for a few days.  DIET: Your first meal following the procedure should be a light meal and then it is ok to progress to your normal diet.  A half-sandwich or bowl of soup is an example of a good first meal.  Heavy or fried foods are harder to digest and may make you feel nauseous or bloated.  Likewise meals heavy in dairy and vegetables can cause extra gas to form and this can also increase the bloating.  Drink plenty of fluids but you should avoid alcoholic beverages for 24 hours.  ACTIVITY: Your care partner should take you home directly after the procedure.  You should plan to take it easy, moving slowly for the rest of the day.  You can resume normal activity the day after the procedure however you should NOT DRIVE or use heavy machinery for 24 hours (because of the sedation medicines used during the test).    SYMPTOMS TO REPORT IMMEDIATELY: A gastroenterologist can be reached at any hour.  During normal business hours, 8:30 AM to 5:00 PM Monday through Friday,  call [phone].  After hours and on weekends, please call the GI answering service at [phone] who will take a message and have the physician on call contact you.   Following lower endoscopy (colonoscopy or flexible sigmoidoscopy):  Excessive amounts of blood in the stool  Significant tenderness or worsening of abdominal pains  Swelling of the abdomen that is new, acute  Fever of 100F or higher    FOLLOW UP: If any biopsies were taken you will be contacted by phone or by letter within the next 1-3 weeks.  Call your gastroenterologist if you have not heard about the biopsies in 3 weeks.  Our staff will call the home number listed on your records the next business day following your procedure to check on you and address any questions or concerns that you may have at that time regarding the information given to you following your procedure. This is a courtesy call and so if there is no answer at the home number and we have not heard from you through the emergency physician on call, we will assume that you have returned to your regular daily activities without incident.  SIGNATURES/CONFIDENTIALITY: You and/or your care partner have signed paperwork which will be entered into your electronic medical record.  These signatures attest to the fact that that the information above on your After Visit Summary has been reviewed and is understood.  Full responsibility of the confidentiality   this discharge information lies with you and/or your care-partner.   INFORMATION ON POLYPS GIVEN TO YOU TODAY 

## 2014-04-02 NOTE — Op Note (Signed)
[facility]  [address] [place], [address]   COLONOSCOPY PROCEDURE REPORT  PATIENT: Krystal Gates, Krystal Gates  MR#: [id number] BIRTHDATE: [birth year] , 62  yrs. old GENDER: Female ENDOSCOPIST: Inda Castle, MD REFERRED MW:NUUVO Jeanie Cooks, M.D. PROCEDURE DATE:  04/02/2014 PROCEDURE:   Colonoscopy with snare polypectomy First Screening Colonoscopy - Avg.  risk and is 50 yrs.  old or older Yes.  Prior Negative Screening - Now for repeat screening. N/A  History of Adenoma - Now for follow-up colonoscopy & has been > or = to 3 yrs.  N/A  Polyps Removed Today? Yes. ASA CLASS:   Class II INDICATIONS:average risk screening. MEDICATIONS: MAC sedation, administered by CRNA and Propofol (Diprivan) 230 mg IV  DESCRIPTION OF PROCEDURE:   After the risks benefits and alternatives of the procedure were thoroughly explained, informed consent was obtained.  A digital rectal exam revealed no abnormalities of the rectum.   The LB [id number]  endoscope was introduced through the anus and advanced to the cecum, which was identified by both the appendix and ileocecal valve. No adverse events experienced.   The quality of the prep was excellent using Suprep  The instrument was then slowly withdrawn as the colon was fully examined.      COLON FINDINGS: A sessile polyp measuring 3 mm in size was found in the ascending colon.  A polypectomy was performed with a cold snare.  The resection was complete and the polyp tissue was completely retrieved.   The colon was otherwise normal.  There was no diverticulosis, inflammation, polyps or cancers unless previously stated.  Retroflexed views revealed no abnormalities. The time to cecum=3 minutes 22 seconds.  Withdrawal time=10 minutes 16 seconds.  The scope was withdrawn and the procedure completed. COMPLICATIONS: There were no complications.  ENDOSCOPIC IMPRESSION: 1.   Sessile polyp measuring 3 mm in size was found in the  ascending colon; polypectomy was performed with a cold snare 2.   The colon was otherwise normal  RECOMMENDATIONS: If the polyp(s) removed today are proven to be adenomatous (pre-cancerous) polyps, you will need a repeat colonoscopy in 5 years.  Otherwise you should continue to follow colorectal cancer screening guidelines for "routine risk" patients with colonoscopy in 10 years.  You will receive a letter within 1-2 weeks with the results of your biopsy as well as final recommendations.  Please call my office if you have not received a letter after 3 weeks.   eSigned:  Inda Castle, MD 04/02/2014 10:36 AM   cc:   PATIENT NAME:  Cionna, Collantes MR#: [id number]

## 2014-04-03 ENCOUNTER — Telehealth: Payer: Self-pay | Admitting: *Deleted

## 2014-04-03 NOTE — Telephone Encounter (Signed)
  Follow up Call-  Call back number 04/02/2014  Post procedure Call Back phone  # [phone]  Permission to leave phone message Yes    Not able to leave message

## 2014-04-07 ENCOUNTER — Encounter: Payer: Self-pay | Admitting: Gastroenterology

## 2014-08-27 ENCOUNTER — Other Ambulatory Visit (HOSPITAL_COMMUNITY): Payer: Self-pay | Admitting: Internal Medicine

## 2014-08-27 DIAGNOSIS — Z1231 Encounter for screening mammogram for malignant neoplasm of breast: Secondary | ICD-10-CM

## 2014-09-10 ENCOUNTER — Ambulatory Visit (HOSPITAL_COMMUNITY)
Admission: RE | Admit: 2014-09-10 | Discharge: 2014-09-10 | Disposition: A | Discharge status: Home / Self Care | Payer: PRIVATE HEALTH INSURANCE | Source: Ambulatory Visit | Attending: Internal Medicine | Admitting: Internal Medicine

## 2014-09-10 DIAGNOSIS — Z1231 Encounter for screening mammogram for malignant neoplasm of breast: Secondary | ICD-10-CM | POA: Diagnosis present

## 2015-01-29 ENCOUNTER — Other Ambulatory Visit (HOSPITAL_COMMUNITY): Payer: Self-pay | Admitting: Internal Medicine

## 2015-01-29 DIAGNOSIS — E2839 Other primary ovarian failure: Secondary | ICD-10-CM

## 2015-02-01 ENCOUNTER — Ambulatory Visit (HOSPITAL_COMMUNITY)
Admission: RE | Admit: 2015-02-01 | Discharge: 2015-02-01 | Disposition: A | Discharge status: Home / Self Care | Payer: 59 | Source: Ambulatory Visit | Attending: Internal Medicine | Admitting: Internal Medicine

## 2015-02-01 DIAGNOSIS — Z780 Asymptomatic menopausal state: Secondary | ICD-10-CM | POA: Diagnosis not present

## 2015-02-01 DIAGNOSIS — Z13820 Encounter for screening for osteoporosis: Secondary | ICD-10-CM | POA: Diagnosis present

## 2015-02-01 DIAGNOSIS — E2839 Other primary ovarian failure: Secondary | ICD-10-CM

## 2015-09-16 ENCOUNTER — Other Ambulatory Visit: Payer: Self-pay

## 2015-09-16 DIAGNOSIS — Z1231 Encounter for screening mammogram for malignant neoplasm of breast: Secondary | ICD-10-CM

## 2015-10-26 ENCOUNTER — Ambulatory Visit
Admission: RE | Admit: 2015-10-26 | Discharge: 2015-10-26 | Disposition: A | Discharge status: Home / Self Care | Payer: 59 | Source: Ambulatory Visit

## 2015-10-26 DIAGNOSIS — Z1231 Encounter for screening mammogram for malignant neoplasm of breast: Secondary | ICD-10-CM

## 2016-01-27 ENCOUNTER — Emergency Department (HOSPITAL_COMMUNITY)
Admission: EM | Admit: 2016-01-27 | Discharge: 2016-01-27 | Disposition: A | Discharge status: Home / Self Care | Payer: BLUE CROSS/BLUE SHIELD | Attending: Emergency Medicine | Admitting: Emergency Medicine

## 2016-01-27 ENCOUNTER — Encounter (HOSPITAL_COMMUNITY): Payer: Self-pay | Admitting: Emergency Medicine

## 2016-01-27 DIAGNOSIS — E785 Hyperlipidemia, unspecified: Secondary | ICD-10-CM | POA: Insufficient documentation

## 2016-01-27 DIAGNOSIS — R040 Epistaxis: Secondary | ICD-10-CM | POA: Insufficient documentation

## 2016-01-27 DIAGNOSIS — R519 Headache, unspecified: Secondary | ICD-10-CM

## 2016-01-27 DIAGNOSIS — M5430 Sciatica, unspecified side: Secondary | ICD-10-CM | POA: Diagnosis not present

## 2016-01-27 DIAGNOSIS — I10 Essential (primary) hypertension: Secondary | ICD-10-CM | POA: Insufficient documentation

## 2016-01-27 DIAGNOSIS — M1990 Unspecified osteoarthritis, unspecified site: Secondary | ICD-10-CM | POA: Diagnosis not present

## 2016-01-27 DIAGNOSIS — Z880 Allergy status to penicillin: Secondary | ICD-10-CM | POA: Insufficient documentation

## 2016-01-27 DIAGNOSIS — R51 Headache: Secondary | ICD-10-CM | POA: Diagnosis present

## 2016-01-27 DIAGNOSIS — Z79899 Other long term (current) drug therapy: Secondary | ICD-10-CM | POA: Diagnosis not present

## 2016-01-27 DIAGNOSIS — Z7982 Long term (current) use of aspirin: Secondary | ICD-10-CM | POA: Insufficient documentation

## 2016-01-27 LAB — CBC
HCT: 43.8 % (ref 36.0–46.0)
HEMOGLOBIN: 14.2 g/dL (ref 12.0–15.0)
MCH: 29 pg (ref 26.0–34.0)
MCHC: 32.4 g/dL (ref 30.0–36.0)
MCV: 89.6 fL (ref 78.0–100.0)
Platelets: 313 10*3/uL (ref 150–400)
RBC: 4.89 MIL/uL (ref 3.87–5.11)
RDW: 14.1 % (ref 11.5–15.5)
WBC: 13.5 10*3/uL — ABNORMAL HIGH (ref 4.0–10.5)

## 2016-01-27 MED ORDER — KETOROLAC TROMETHAMINE 15 MG/ML IJ SOLN
15.0000 mg | Freq: Once | INTRAMUSCULAR | Status: AC
  Administered 2016-01-27: 15 mg via INTRAVENOUS
  Filled 2016-01-27: qty 1

## 2016-01-27 MED ORDER — SODIUM CHLORIDE 0.9 % IV BOLUS (SEPSIS)
1000.0000 mL | Freq: Once | INTRAVENOUS | Status: AC
  Administered 2016-01-27: 1000 mL via INTRAVENOUS

## 2016-01-27 MED ORDER — METOCLOPRAMIDE HCL 5 MG/ML IJ SOLN
10.0000 mg | Freq: Once | INTRAMUSCULAR | Status: AC
  Administered 2016-01-27: 10 mg via INTRAVENOUS
  Filled 2016-01-27: qty 2

## 2016-01-27 MED ORDER — DIPHENHYDRAMINE HCL 50 MG/ML IJ SOLN
25.0000 mg | Freq: Once | INTRAMUSCULAR | Status: AC
  Administered 2016-01-27: 25 mg via INTRAVENOUS
  Filled 2016-01-27: qty 1

## 2016-01-27 NOTE — ED Notes (Signed)
Pt c/o intermittent moderate nose bleeding onset yesterday, headache, worse during episodes of nose bleed. Pt reports long term history of the same. Pt reports headache feels similar to past headaches during viral infections. No bleeding currently.

## 2016-01-27 NOTE — Discharge Instructions (Signed)
Please read and follow all provided instructions.  Your diagnoses today include:  1. Nonintractable headache, unspecified chronicity pattern, unspecified headache type    Tests performed today include:  Vital signs. See below for your results today.   Medications:  In the Emergency Department you received:  Reglan - antinausea/headache medication  Benadryl - antihistamine to counteract potential side effects of reglan  Toradol - NSAID medication similar to ibuprofen  Take any prescribed medications only as directed.  Additional information:  Follow any educational materials contained in this packet.  You are having a headache. No specific cause was found today for your headache. It may have been a migraine or other cause of headache. Stress, anxiety, fatigue, and depression are common triggers for headaches.   Your headache today does not appear to be life-threatening or require hospitalization, but often the exact cause of headaches is not determined in the emergency department. Therefore, follow-up with your doctor is very important to find out what may have caused your headache and whether or not you need any further diagnostic testing or treatment.   Sometimes headaches can appear benign (not harmful), but then more serious symptoms can develop which should prompt an immediate re-evaluation by your doctor or the emergency department.  BE VERY CAREFUL not to take multiple medicines containing Tylenol (also called acetaminophen). Doing so can lead to an overdose which can damage your liver and cause liver failure and possibly death.   Follow-up instructions: Please follow-up with your primary care provider in the next 3 days for further evaluation of your symptoms.   Return instructions:   Please return to the Emergency Department if you experience worsening symptoms.  Return if the medications do not resolve your headache, if it recurs, or if you have multiple episodes of  vomiting or cannot keep down fluids.  Return if you have a change from the usual headache.  RETURN IMMEDIATELY IF you:  Develop a sudden, severe headache  Develop confusion or become poorly responsive or faint  Develop a fever above 100.96F or problem breathing  Have a change in speech, vision, swallowing, or understanding  Develop new weakness, numbness, tingling, incoordination in your arms or legs  Have a seizure  Please return if you have any other emergent concerns.  Additional Information:  Your vital signs today were: BP 141/82 mmHg   Pulse 98   Temp(Src) 98.8 F (37.1 C) (Oral)   Resp 16   SpO2 96% If your blood pressure (BP) was elevated above 135/85 this visit, please have this repeated by your doctor within one month. --------------

## 2016-01-27 NOTE — ED Provider Notes (Signed)
CSN: [id number]     Arrival date & time 01/27/16  1626 History   First MD Initiated Contact with Patient 01/27/16 1945     Chief Complaint  Patient presents with  . Epistaxis  . Headache   (Consider location/radiation/quality/duration/timing/severity/associated sxs/prior Treatment) HPI 65 y.o. female with a hx of HTN. HLD, presents to the Emergency Department today due to epistaxis and headache. Pt notes that she has had viral symptoms for the past week with associated headache. Notes congestion. No rhinorrhea. No fever. No cough. No CP/SOB/ABD pain. No N/V/D. States headache is bilateral and feels like a throbbing sensation. Onset was gradual. No vision changes. No numbness/tingling. Rates pain at 6/10. Tried tylenol with minimal relief. Has also had epistaxis x 4 today. Bleeding controlled currently. Notes headache is worse with nose bleed and rates 8/10. Notes lightheadedness afterwards. No other symptoms noted.    Past Medical History  Diagnosis Date  . Hypertension   . Arthritis   . Hyperlipidemia   . Sciatica   . Heart disease     "40% heart blockage, dx 20 years ago"per pt. NO treatment.   Past Surgical History  Procedure Laterality Date  . Rectal fissure    . Carpel tunnel Bilateral   . Abdominal hysterectomy    . Ectopic pregnancy surgery     Family History  Problem Relation Age of Onset  . Adopted: Yes   Social History  Substance Use Topics  . Smoking status: Never Smoker   . Smokeless tobacco: Never Used  . Alcohol Use: No   OB History    No data available     Review of Systems ROS reviewed and all are negative for acute change except as noted in the HPI.  Allergies  Codeine; Penicillins; Talwin; Tylox; and Zithromax  Home Medications   Prior to Admission medications   Medication Sig Start Date End Date Taking? Authorizing Provider  aspirin EC 81 MG tablet Take 81 mg by mouth every morning.   Yes Historical Provider, MD  cyclobenzaprine (FLEXERIL) 10 MG  tablet Take 1 tablet (10 mg total) by mouth 2 (two) times daily as needed for muscle spasms. 04/15/13  Yes Domenic Moras, PA-C  hydrochlorothiazide (HYDRODIURIL) 25 MG tablet Take 25 mg by mouth daily.   Yes Historical Provider, MD  lisinopril (PRINIVIL,ZESTRIL) 20 MG tablet Take 20 mg by mouth every morning.   Yes Historical Provider, MD  pravastatin (PRAVACHOL) 40 MG tablet Take 40 mg by mouth every evening.   Yes Historical Provider, MD   BP 139/76 mmHg  Pulse 99  Temp(Src) 98.4 F (36.9 C) (Oral)  Resp 16  SpO2 98%   Physical Exam  Constitutional: She is oriented to person, place, and time. She appears well-developed and well-nourished.  HENT:  Head: Normocephalic and atraumatic.  No bruits auscultated over Temporal arteries. Nontender to palpation   Eyes: EOM are normal. Pupils are equal, round, and reactive to light.  Neck: Normal range of motion. Neck supple. No tracheal deviation present.  Cardiovascular: Normal rate, regular rhythm and normal heart sounds.   Pulmonary/Chest: Effort normal and breath sounds normal. No respiratory distress. She has no wheezes. She has no rales.  Abdominal: Soft. Normal appearance and bowel sounds are normal. There is no tenderness.  Musculoskeletal: Normal range of motion.  Neurological: She is alert and oriented to person, place, and time. She has normal strength. No cranial nerve deficit or sensory deficit.  Skin: Skin is warm and dry.  Psychiatric: She has a  normal mood and affect. Her behavior is normal. Thought content normal.  Nursing note and vitals reviewed.   ED Course  Procedures (including critical care time) Labs Review Labs Reviewed  CBC - Abnormal; Notable for the following:    WBC 13.5 (*)    All other components within normal limits   Imaging Review No results found. I have personally reviewed and evaluated these images and lab results as part of my medical decision-making.   EKG Interpretation None      MDM  I have  reviewed and evaluated the relevant laboratory values.  I have reviewed the relevant previous healthcare records. I obtained HPI from historian. Patient discussed with supervising physician  ED Course:  Assessment: Patient is a 65yF that presents with headache x 1 week. Gradual. Patient is without high-risk features of headache including: Sudden onset/thunderclap HA, No similar headache in past, Altered mental status, Accompanying seizure, Headache with exertion, History of immunocompromise, Neck or shoulder pain, Fever, Use of anticoagulation, Family history of spontaneous SAH, Concomitant drug use, Toxic exposure.  Patient has a normal complete neurological exam, normal vital signs, normal level of consciousness, no signs of meningismus, is well-appearing/non-toxic appearing, no signs of trauma. No papilledema, no pain over the temporal arteries. Imaging with CT/MRI not indicated given history and physical exam findings. No dangerous or life-threatening conditions suspected or identified by history, physical exam, and by work-up. No indications for hospitalization identified. Pt symptoms resolved with administration of fluids, Toradol, Reglan and Benadryl. Discussed with patient to follow up with PCP for further management. At time of discharge, Patient is in no acute distress. Vital Signs are stable. Patient is able to ambulate. Patient able to tolerate PO.      Disposition/Plan:  DC Home Additional Verbal discharge instructions given and discussed with patient.  Pt Instructed to f/u with PCP  Return precautions given Pt acknowledges and agrees with plan  Supervising Physician Gareth Morgan, MD   Final diagnoses:  Nonintractable headache, unspecified chronicity pattern, unspecified headache type        Shary Decamp, PA-C 01/27/16 [id number]  Gareth Morgan, MD 01/28/16 1404

## 2016-10-16 ENCOUNTER — Other Ambulatory Visit: Payer: Self-pay | Admitting: Internal Medicine

## 2016-10-16 DIAGNOSIS — Z1231 Encounter for screening mammogram for malignant neoplasm of breast: Secondary | ICD-10-CM

## 2016-11-24 ENCOUNTER — Ambulatory Visit
Admission: RE | Admit: 2016-11-24 | Discharge: 2016-11-24 | Disposition: A | Discharge status: Home / Self Care | Payer: Medicare Other | Source: Ambulatory Visit | Attending: Internal Medicine | Admitting: Internal Medicine

## 2016-11-24 DIAGNOSIS — Z1231 Encounter for screening mammogram for malignant neoplasm of breast: Secondary | ICD-10-CM

## 2017-04-22 ENCOUNTER — Emergency Department (HOSPITAL_COMMUNITY)
Admission: EM | Admit: 2017-04-22 | Discharge: 2017-04-22 | Disposition: A | Discharge status: Home / Self Care | Payer: Medicare Other | Attending: Emergency Medicine | Admitting: Emergency Medicine

## 2017-04-22 ENCOUNTER — Encounter (HOSPITAL_COMMUNITY): Payer: Self-pay | Admitting: Nurse Practitioner

## 2017-04-22 DIAGNOSIS — Z79899 Other long term (current) drug therapy: Secondary | ICD-10-CM | POA: Insufficient documentation

## 2017-04-22 DIAGNOSIS — I10 Essential (primary) hypertension: Secondary | ICD-10-CM | POA: Diagnosis not present

## 2017-04-22 DIAGNOSIS — R51 Headache: Secondary | ICD-10-CM | POA: Diagnosis not present

## 2017-04-22 DIAGNOSIS — I2510 Atherosclerotic heart disease of native coronary artery without angina pectoris: Secondary | ICD-10-CM | POA: Insufficient documentation

## 2017-04-22 DIAGNOSIS — Z7982 Long term (current) use of aspirin: Secondary | ICD-10-CM | POA: Insufficient documentation

## 2017-04-22 DIAGNOSIS — I119 Hypertensive heart disease without heart failure: Secondary | ICD-10-CM | POA: Insufficient documentation

## 2017-04-22 DIAGNOSIS — H53143 Visual discomfort, bilateral: Secondary | ICD-10-CM | POA: Diagnosis present

## 2017-04-22 MED ORDER — TETRACAINE HCL 0.5 % OP SOLN
2.0000 [drp] | Freq: Once | OPHTHALMIC | Status: AC
  Administered 2017-04-22: 2 [drp] via OPHTHALMIC
  Filled 2017-04-22: qty 4

## 2017-04-22 MED ORDER — POLYMYXIN B-TRIMETHOPRIM 10000-0.1 UNIT/ML-% OP SOLN
1.0000 [drp] | Freq: Four times a day (QID) | OPHTHALMIC | Status: DC
  Administered 2017-04-22: 1 [drp] via OPHTHALMIC
  Filled 2017-04-22: qty 10

## 2017-04-22 MED ORDER — PREDNISOLONE ACETATE 1 % OP SUSP
1.0000 [drp] | Freq: Four times a day (QID) | OPHTHALMIC | Status: DC
  Administered 2017-04-22: 1 [drp] via OPHTHALMIC
  Filled 2017-04-22: qty 5

## 2017-04-22 MED ORDER — DIPHENHYDRAMINE HCL 50 MG/ML IJ SOLN
12.5000 mg | Freq: Once | INTRAMUSCULAR | Status: DC
  Filled 2017-04-22: qty 1

## 2017-04-22 MED ORDER — PROCHLORPERAZINE EDISYLATE 5 MG/ML IJ SOLN
10.0000 mg | Freq: Once | INTRAMUSCULAR | Status: DC
  Filled 2017-04-22: qty 2

## 2017-04-22 NOTE — ED Provider Notes (Signed)
[facility] DEPT Provider Note   CSN: [id number] Arrival date & time: 04/22/17  1759 By signing my name below, I, Fabian Sharp, attest that this documentation has been prepared under the direction and in the presence of non-physician provider, Flonnie Overman, [place]. Electronically Signed: Fabian Sharp, ED Scribe. 04/22/2017. 7:36 PM.    History   Chief Complaint Chief Complaint  Patient presents with  . Photophobia   HPI Comments:  Krystal Gates is a 66 y.o. female with a past medical history of HTN, HLD and heart disease who presents to the Emergency Department complaining of constant, progressively worsening, severe photophobia to bilateral eyes onset this morning. Per pt, she woke up with photophobia and associated sharp, shooting eye pain to her left eye. Later this afternoon, it progressed to her right eye. Pt reports associated headache during exam, but denies any headache earlier today. She denies any pain or discomfort in complete darkness.  She has tried warm compression to treat the pain with no improvement. No other OTC treatments tried PTA. Pt last had her eyes examined by an optometrist in April, and was treated for dry eye and was given prednisone for inflammation. Denies change in vision, blurry vision, double vision, eye discharge, eye itching, fevers, or chills. Pt reports having hx of migraines in the past when she was much younger but denies having anything recently and never having anything like this.   The history is provided by the patient. No language interpreter was used.   Past Medical History:  Diagnosis Date  . Arthritis   . Heart disease    "40% heart blockage, dx 20 years ago"per pt. NO treatment.  . Hyperlipidemia   . Hypertension   . Sciatica     Patient Active Problem List   Diagnosis Date Noted  . HYPERTENSION, BENIGN SYSTEMIC 01/24/2007  . CORONARY, ARTERIOSCLEROSIS 01/24/2007  . BRADYCARDIA 01/24/2007  . INSOMNIA NOS 01/24/2007     Past Surgical History:  Procedure Laterality Date  . ABDOMINAL HYSTERECTOMY    . carpel tunnel Bilateral   . ECTOPIC PREGNANCY SURGERY    . rectal fissure      OB History    No data available      Home Medications    Prior to Admission medications   Medication Sig Start Date End Date Taking? Authorizing Provider  aspirin EC 81 MG tablet Take 81 mg by mouth every morning.    [provider]  cyclobenzaprine (FLEXERIL) 10 MG tablet Take 1 tablet (10 mg total) by mouth 2 (two) times daily as needed for muscle spasms. 04/15/13   Domenic Moras, PA-C  hydrochlorothiazide (HYDRODIURIL) 25 MG tablet Take 25 mg by mouth daily.    [provider]  lisinopril (PRINIVIL,ZESTRIL) 20 MG tablet Take 20 mg by mouth every morning.    [provider]  pravastatin (PRAVACHOL) 40 MG tablet Take 40 mg by mouth every evening.    [provider]    Family History Family History  Problem Relation Age of Onset  . Adopted: Yes    Social History Social History  Substance Use Topics  . Smoking status: Never Smoker  . Smokeless tobacco: Never Used  . Alcohol use No     Allergies   Codeine; Penicillins; Talwin [pentazocine]; Tylox [oxycodone-acetaminophen]; and Zithromax [azithromycin]  Review of Systems Review of Systems  Constitutional: Negative for chills and fever.  Eyes: Positive for photophobia and pain. Negative for discharge.  Neurological: Positive for headaches.   Physical Exam Updated Vital Signs  BP 125/77 (BP Location: Left Arm)   Pulse 68   Temp 98.8 F (37.1 C) (Oral)   Resp 20   Ht 5\' 3"  (1.6 m)   Wt 87.1 kg (192 lb)   BMI 34.01 kg/m   Physical Exam  Constitutional: She is oriented to person, place, and time. She appears well-developed and well-nourished.  Uncomfortable. Unable to tolerate light. Ambient light used for exam.   HENT:  Head: Normocephalic and atraumatic.  Nose: Nose normal.  Eyes: EOM and lids are normal. Pupils  are equal, round, and reactive to light. Lids are everted and swept, no foreign bodies found. Right conjunctiva is injected. Left conjunctiva is injected. Right eye exhibits no nystagmus. Left eye exhibits no nystagmus.  Fundoscopic exam:      The right eye shows no AV nicking, no exudate and no hemorrhage.       The left eye shows no AV nicking ( ), no exudate and no hemorrhage.  Both eyes injected. Tender to palpation.   Tonopen showed: Right - 13 Left - 14  Neck: Normal range of motion.  Cardiovascular: Normal rate.   Pulmonary/Chest: Effort normal. No respiratory distress.  Normal work of breathing. No respiratory distress noted.   Abdominal: Soft.  Musculoskeletal: Normal range of motion.  Neurological: She is alert and oriented to person, place, and time.  Cranial Nerves:  III,IV, VI: ptosis not present, extra-ocular movements intact bilaterally, direct and consensual pupillary light reflexes intact bilaterally V: facial sensation, jaw opening, and bite strength equal bilaterally VII: eyebrow raise, eyelid close, smile, frown, pucker equal bilaterally VIII: hearing grossly normal bilaterally  IX,X: palate elevation and swallowing intact XI: bilateral shoulder shrug and lateral head rotation equal and strong XII: midline tongue extension  Negative pronator drift, negative Romberg, negative RAM's, negative heel-to-shin, negative finger to nose.    Sensory intact.  Muscle strength 5/5 Patient able to ambulate without difficulty.   Skin: Skin is warm.  Psychiatric: She has a normal mood and affect. Her behavior is normal.  Nursing note and vitals reviewed.    ED Treatments / Results  DIAGNOSTIC STUDIES:  Oxygen Saturation is 97% on room air, normal by my interpretation.    COORDINATION OF CARE:  7:42 PM Discussed treatment plan with pt at bedside and pt agreed to plan.  Labs (all labs ordered are listed, but only abnormal results are displayed) Labs Reviewed - No data  to display  EKG  EKG Interpretation None       Radiology No results found.  Procedures Procedures (including critical care time)  Medications Ordered in ED Medications  prednisoLONE acetate (PRED FORTE) 1 % ophthalmic suspension 1 drop (not administered)  trimethoprim-polymyxin b (POLYTRIM) ophthalmic solution 1 drop (not administered)  tetracaine (PONTOCAINE) 0.5 % ophthalmic solution 2 drop (2 drops Both Eyes Given 04/22/17 2030)     Initial Impression / Assessment and Plan / ED Course  I have reviewed the triage vital signs and the nursing notes.  Pertinent labs & imaging results that were available during my care of the patient were reviewed by me and considered in my medical decision making (see chart for details).     Patient with extreme photophobia and injected conjunctiva. Tono-Pen read right eye 13, left eye 14. Patient was uncomfortable with light, however and darkness she was in no apparent distress, afebrile, hemodynamically stable. No history of foreign body. She is neurologically intact. Coordination intact patient was seen and leg without difficulty.   Dr. Estanislado Spire, also  saw and evaluated patient and recommended to call ophthalmologist.  I spoke with Dr. Kristeen Miss, Ophthalmologist, who stated that she would like to have patient be seen on Tuesday at her clinic. She recommended patient to have prednisolone acetate and Polytrim prescribed to pt for 1 week.  Low suspicion for angle closure glaucoma, or any other acute emergent process at this time.   Patient given instruction to follow-up with Dr. Kristeen Miss. Patient verbally understood and is in agreement with assessment and plan. Reasons to immediately return to ED discussed.   Final Clinical Impressions(s) / ED Diagnoses   Final diagnoses:  Photophobia of both eyes    New Prescriptions New Prescriptions   No medications on file    I personally performed the services described in this documentation, which was  scribed in my presence. The recorded information has been reviewed and is accurate.    Flonnie Overman [place], [place] 04/22/17 [phone]

## 2017-04-22 NOTE — ED Provider Notes (Signed)
Medical screening examination/treatment/procedure(s) were conducted as a shared visit with non-physician practitioner(s) and myself.  I personally evaluated the patient during the encounter. Briefly, the patient is a 66 y.o. female acute onset of bilateral conjunctivitis and acute her pain. Intraocular pressures without evidence to suggest acute closed angle glaucoma. Possible scleritis, uveitis, conjunctivitis. Case is discussed with ophthalmology who recommended steroid and antibiotic drops. They will see the patient in clinic in 2 days. The patient is safe for discharge with strict return precautions.     Fatima Blank, MD 04/23/17 [phone]

## 2017-04-22 NOTE — Discharge Instructions (Signed)
Please take prednisolone acetate 4 times daily. Please also take Polytrim 4 times daily. Both medications should be taken for 1 week. Please see Dr. Kristeen Miss on Tuesday regarding today's visit.  Get help right away if: You have severe eye pain even when you are not in bright lights. You have photophobia and you also have: Blurry vision. A headache. Red eyes. You have neck pain.

## 2017-04-22 NOTE — ED Triage Notes (Signed)
Pt states this morning she woke up with severe light sensitivity on her left eye that has progressed bilaterally. Adds that she has been under the care of an ye doctor but could not afford the specialized procedure that they had recommended.

## 2017-06-13 ENCOUNTER — Other Ambulatory Visit: Payer: Self-pay | Admitting: Internal Medicine

## 2017-06-13 DIAGNOSIS — E2839 Other primary ovarian failure: Secondary | ICD-10-CM

## 2018-01-31 ENCOUNTER — Encounter: Payer: Self-pay | Admitting: Podiatry

## 2018-01-31 ENCOUNTER — Ambulatory Visit (INDEPENDENT_AMBULATORY_CARE_PROVIDER_SITE_OTHER): Payer: Medicare Other | Admitting: Podiatry

## 2018-01-31 VITALS — BP 109/69 | HR 84 | Resp 16

## 2018-01-31 DIAGNOSIS — I739 Peripheral vascular disease, unspecified: Secondary | ICD-10-CM | POA: Diagnosis not present

## 2018-01-31 DIAGNOSIS — L600 Ingrowing nail: Secondary | ICD-10-CM

## 2018-01-31 NOTE — Patient Instructions (Signed)

## 2018-01-31 NOTE — Progress Notes (Signed)
Subjective:    Patient ID: Krystal Gates, female    DOB: Apr 03, 1951, 67 y.o.   MRN: [id number]  HPI 67 year old female presents the office today for concerns of ingrown toenail to the left big toe and the medial aspect.  This is been ongoing for about 1 month but is been ongoing much longer than that.  For the last month she get a pedicure there was cut the area about however over the last month is becoming more painful.  She denies any redness or drainage or any pus or any swelling. She has no other complaints today.   Review of Systems  All other systems reviewed and are negative.  Past Medical History:  Diagnosis Date  . Arthritis   . Heart disease    "40% heart blockage, dx 20 years ago"per pt. NO treatment.  . Hyperlipidemia   . Hypertension   . Sciatica     Past Surgical History:  Procedure Laterality Date  . ABDOMINAL HYSTERECTOMY    . carpel tunnel Bilateral   . ECTOPIC PREGNANCY SURGERY    . rectal fissure       Current Outpatient Medications:  .  amitriptyline (ELAVIL) 25 MG tablet, Take 25 mg by mouth daily. , Disp: , Rfl:  .  aspirin EC 81 MG tablet, Take 81 mg by mouth every morning., Disp: , Rfl:  .  cyclobenzaprine (FLEXERIL) 10 MG tablet, Take 1 tablet (10 mg total) by mouth 2 (two) times daily as needed for muscle spasms., Disp: 20 tablet, Rfl: 0 .  hydrochlorothiazide (HYDRODIURIL) 25 MG tablet, Take 25 mg by mouth daily., Disp: , Rfl:  .  lisinopril (PRINIVIL,ZESTRIL) 20 MG tablet, Take 20 mg by mouth every morning., Disp: , Rfl:  .  Multiple Vitamins-Minerals (MULTIVITAMIN PO), Take by mouth. Women's Adult Gummy Multivitamin - takes 2 gummies a day, Disp: , Rfl:  .  pravastatin (PRAVACHOL) 40 MG tablet, Take 40 mg by mouth every evening., Disp: , Rfl:   Allergies  Allergen Reactions  . Codeine Nausea Only  . Penicillins Hives    Has patient had a PCN reaction causing immediate rash, facial/tongue/throat swelling, SOB or lightheadedness with  hypotension: No Has patient had a PCN reaction causing severe rash involving mucus membranes or skin necrosis: pt had hives  Has patient had a PCN reaction that required hospitalization: no Has patient had a PCN reaction occurring within the last 10 years: no If all of the above answers are "NO", then may proceed with Cephalosporin use.   Loma Messing [Pentazocine] Nausea Only  . Tylox [Oxycodone-Acetaminophen] Nausea Only  . Zithromax [Azithromycin] Other (See Comments)    Headache    Social History   Socioeconomic History  . Marital status: Widowed    Spouse name: Not on file  . Number of children: Not on file  . Years of education: Not on file  . Highest education level: Not on file  Social Needs  . Financial resource strain: Not on file  . Food insecurity - worry: Not on file  . Food insecurity - inability: Not on file  . Transportation needs - medical: Not on file  . Transportation needs - non-medical: Not on file  Occupational History  . Not on file  Tobacco Use  . Smoking status: Never Smoker  . Smokeless tobacco: Never Used  Substance and Sexual Activity  . Alcohol use: No  . Drug use: No  . Sexual activity: No  Other Topics Concern  . Not on file  Social History Narrative  . Not on file        Objective:   Physical Exam General: AAO x3, NAD  Dermatological: There is incurvation present on the left medial hallux toenail.  There is minimal edema to the area is no erythema or increase in warmth.  There is no drainage or pus expressed.  No ascending sialitis.  No significant tenderness and there is no signs of infection or inflammation to the lateral aspect.  No open lesions or pre-ulcerative lesions identified otherwise.  Vascular: Dorsalis Pedis artery and Posterior Tibial artery pedal pulses are 2/4 bilateral with immedate capillary fill time.  There is no pain with calf compression, swelling, warmth, erythema.   Neruologic: Grossly intact via light touch  bilateral. Protective threshold with Semmes Wienstein monofilament intact to all pedal sites bilateral.   Musculoskeletal: No gross boney pedal deformities bilateral. No pain, crepitus, or limitation noted with foot and ankle range of motion bilateral. Muscular strength 5/5 in all groups tested bilateral.  Gait: Unassisted, Nonantalgic.     Assessment & Plan:  67 year old female left medial hallux symptomatic ingrown toenail -Treatment options discussed including all alternatives, risks, and complications -Etiology of symptoms were discussed -At this time, the patient is requesting partial nail removal with chemical matricectomy to the symptomatic portion of the nail. Risks and complications were discussed with the patient for which they understand and written consent was obtained for the procedure. Under sterile conditions a total of 3 mL of a mixture of 2% lidocaine plain and 0.5% Marcaine plain was infiltrated in a hallux block fashion. Once anesthetized, the skin was prepped in sterile fashion. A tourniquet was then applied. Next the medial aspect of hallux nail border was then sharply excised making sure to remove the entire offending nail border. Once the nails were ensured to be removed area was debrided and the underlying skin was intact. There is no purulence identified in the procedure. Next phenol was then applied under standard conditions and copiously irrigated. Silvadene was applied. A dry sterile dressing was applied. After application of the dressing the tourniquet was removed and there is found to be an immediate capillary refill time to the digit. The patient tolerated the procedure well any complications. Post procedure instructions were discussed the patient for which he verbally understood. Follow-up in one week for nail check or sooner if any problems are to arise. Discussed signs/symptoms of infection and directed to call the office immediately should any occur or go directly to the  emergency room. In the meantime, encouraged to call the office with any questions, concerns, changes symptoms. -I did do an ABI just to ensure adequate circulation.  It was normal.  Left is 1.22 right was 1.18.  Trula Slade DPM

## 2018-02-04 DIAGNOSIS — L600 Ingrowing nail: Secondary | ICD-10-CM | POA: Insufficient documentation

## 2018-02-07 ENCOUNTER — Ambulatory Visit (INDEPENDENT_AMBULATORY_CARE_PROVIDER_SITE_OTHER): Payer: Medicare Other | Admitting: Podiatry

## 2018-02-07 ENCOUNTER — Encounter: Payer: Self-pay | Admitting: Podiatry

## 2018-02-07 DIAGNOSIS — Z9889 Other specified postprocedural states: Secondary | ICD-10-CM

## 2018-02-07 DIAGNOSIS — L600 Ingrowing nail: Secondary | ICD-10-CM

## 2018-02-07 NOTE — Patient Instructions (Signed)

## 2018-02-10 NOTE — Progress Notes (Signed)
Subjective: Krystal Gates is a 67 y.o.  female returns to office today for follow up evaluation after having left Hallux medial partial nail avulsion performed.  She states that she is doing  "pretty good". Patient has been soaking using epsom salts and applying topical antibiotic covered with bandaid daily.  She states that she was not having any pain but she has started to have some pain over the last 2-3 days.  She denies any drainage or pus coming from the area.  Patient denies fevers, chills, nausea, vomiting. Denies any calf pain, chest pain, SOB.   Objective:  Vitals: Reviewed  General: Well developed, nourished, in no acute distress, alert and oriented x3   Dermatology: Skin is warm, dry and supple bilateral. Left medial hallux nail border appears to be clean, dry, with mild granular tissue and surrounding scab. There is no surrounding erythema, drainage/purulence. There is very minimal swelling to the nail site. The remaining nails appear unremarkable at this time. There are no other lesions or other signs of infection present.  Neurovascular status: Intact. No lower extremity swelling; No pain with calf compression bilateral.  Musculoskeletal: There is mild tenderness to palpation of the medial hallux nail fold. Muscular strength within normal limits bilateral.   Assesement and Plan: S/p partial nail avulsion, doing well.   -Continue soaking in epsom salts twice a day followed by antibiotic ointment and a band-aid. Can leave uncovered at night. Continue this until completely healed.  She was asking if the Epsom salts should burn.  Discussed that it is burning she can switch to antibacterial soap soaks -Given that she is having some increasing pain recently she was having pain previously start Keflex in case of infection although I do not see any overt signs of infection today. -Follow-up in 2 weeks or sooner if needed.  Call any questions or concerns meantime.  She agrees with plan  she has no further questions or concerns.  Trula Slade DPM

## 2018-02-21 ENCOUNTER — Ambulatory Visit (INDEPENDENT_AMBULATORY_CARE_PROVIDER_SITE_OTHER): Payer: Medicare Other | Admitting: Podiatry

## 2018-02-21 ENCOUNTER — Encounter: Payer: Self-pay | Admitting: Podiatry

## 2018-02-21 DIAGNOSIS — L600 Ingrowing nail: Secondary | ICD-10-CM | POA: Diagnosis not present

## 2018-02-21 NOTE — Progress Notes (Signed)
Subjective: 67 year old female presents the office today for follow-up evaluation after having medial aspect of her left hallux toenail removed.  She says that she is doing very well she is having no pain she has not had any swelling or redness or any drainage.  She has noticed some discoloration along the skin just proximal to the nail fold where she points.  Denies any pain associated with this. Denies any systemic complaints such as fevers, chills, nausea, vomiting. No acute changes since last appointment, and no other complaints at this time.   Objective: AAO x3, NAD DP/PT pulses palpable bilaterally, CRT less than 3 seconds Incurvation of the lateral aspect but there is no edema, erythema, increase in warmth.  Procedure site appears to be healed on the medial aspect the left hallux toenail.  There is a small amount of cracking to the distal portion of the toenail and overall the nail itself is hypertrophic, dystrophic with yellow-brown discoloration.  Faint amount of dark discoloration of the proximal nail fold but there is no pain associated with is no fluctuation or crepitation.  This is likely more from resolving inflammation.  Appears to be healed and having no issues today. No open lesions or pre-ulcerative lesions.  No pain with calf compression, swelling, warmth, erythema  Assessment: Healed procedure site left hallux toenail  Plan: -All treatment options discussed with the patient including all alternatives, risks, complications.  -I sharply debrided the toenails of the any complications or bleeding.  I recommended Fungi-Nail to help with overall thickening and for likely nail fungus.  I did debride the toenail so that any complications or bleeding.  Discussed that the discoloration of the skin is likely from resolving inflammation and this is not from infection.  We will continue to monitor.  If not resolving next 2-3 weeks call the office with or sooner if there is any issues. -Patient  encouraged to call the office with any questions, concerns, change in symptoms.   Trula Slade DPM

## 2018-02-21 NOTE — Patient Instructions (Signed)
Look at getting a medication called "fungi-nail" to apply to the left toenail daily to help with the nail fungus.

## 2018-03-11 ENCOUNTER — Ambulatory Visit (INDEPENDENT_AMBULATORY_CARE_PROVIDER_SITE_OTHER): Payer: Medicare Other | Admitting: Podiatry

## 2018-03-11 DIAGNOSIS — L600 Ingrowing nail: Secondary | ICD-10-CM | POA: Diagnosis not present

## 2018-03-11 NOTE — Patient Instructions (Signed)

## 2018-03-12 NOTE — Progress Notes (Signed)
Subjective: 67 year old female presents the office today for concerns of ingrown toenail to the right big toe on both nail corners.  She says the ear is painful with pressure in shoes.  Denies any redness or drainage or any swelling.  She was feeling proceed with the procedure like she had on the left foot previously.  That foot is doing well and having no issues. Denies any systemic complaints such as fevers, chills, nausea, vomiting. No acute changes since last appointment, and no other complaints at this time.   Objective: AAO x3, NAD DP/PT pulses palpable bilaterally, CRT less than 3 seconds Incurvation present along the right hallux toenail on both medial lateral nail corners.  There is no edema, erythema, drainage or pus or any clinical signs of infection noted today.  Tenderness palpation of the nail corners. No open lesions or pre-ulcerative lesions.  No pain with calf compression, swelling, warmth, erythema  Assessment: Ingrown toenail right hallux toenail medial, lateral corners  Plan: -All treatment options discussed with the patient including all alternatives, risks, complications.  -At this time, the patient is requesting partial nail removal with chemical matricectomy to the symptomatic portion of the nail. Risks and complications were discussed with the patient for which they understand and written consent was obtained. Under sterile conditions a total of 3 mL of a mixture of 2% lidocaine plain and 0.5% Marcaine plain was infiltrated in a hallux block fashion. Once anesthetized, the skin was prepped in sterile fashion. A tourniquet was then applied. Next the medial and lateral aspect of hallux nail border was then sharply excised making sure to remove the entire offending nail border. Once the nails were ensured to be removed area was debrided and the underlying skin was intact. There is no purulence identified in the procedure. Next phenol was then applied under standard conditions and  copiously irrigated. Silvadene was applied. A dry sterile dressing was applied. After application of the dressing the tourniquet was removed and there is found to be an immediate capillary refill time to the digit. The patient tolerated the procedure well any complications. Post procedure instructions were discussed the patient for which he verbally understood. Follow-up in one week for nail check or sooner if any problems are to arise. Discussed signs/symptoms of infection and directed to call the office immediately should any occur or go directly to the emergency room. In the meantime, encouraged to call the office with any questions, concerns, changes symptoms. -Patient encouraged to call the office with any questions, concerns, change in symptoms.   Trula Slade DPM

## 2018-03-26 ENCOUNTER — Ambulatory Visit (INDEPENDENT_AMBULATORY_CARE_PROVIDER_SITE_OTHER): Payer: Medicare Other | Admitting: Podiatry

## 2018-03-26 ENCOUNTER — Encounter: Payer: Self-pay | Admitting: Podiatry

## 2018-03-26 DIAGNOSIS — L600 Ingrowing nail: Secondary | ICD-10-CM

## 2018-03-26 NOTE — Progress Notes (Signed)
Subjective: Krystal Gates is a 67 y.o.  female returns to office today for follow up evaluation after having right Hallux medial and lateral nail avulsion performed.  She states that she was doing well however she stopped soaking her foot in Epsom salts after she has a scab formed.  She is doing well but she has some throbbing pain to the area today.  She denies any redness or drainage or any swelling.  Patient denies fevers, chills, nausea, vomiting. Denies any calf pain, chest pain, SOB.   Objective:  Vitals: Reviewed  General: Well developed, nourished, in no acute distress, alert and oriented x3   Dermatology: Skin is warm, dry and supple bilateral.  Right hallux nail border appears to be clean, dry, with mild granular tissue and surrounding scab. There is no surrounding erythema, edema, drainage/purulence. The remaining nails appear unremarkable at this time. There are no other lesions or other signs of infection present.  Neurovascular status: Intact. No lower extremity swelling; No pain with calf compression bilateral.  Musculoskeletal: Decreased tenderness to palpation of the medial lateral hallux nail fold. Muscular strength within normal limits bilateral.   Assesement and Plan: S/p partial nail avulsion, doing well.   -Continue soaking in epsom salts twice a day followed by antibiotic ointment and a band-aid. Can leave uncovered at night. Continue this until completely healed.  -It appears that the area started healed and there is some scab which I did debride today.  There is no drainage or possibly doing to get back to the Epsom salts soaks daily as well as antibiotic ointment and a bandage daily. -If the area has not healed in 2 weeks, call the office for follow-up appointment, or sooner if any problems arise.  -Monitor for any signs/symptoms of infection. Call the office immediately if any occur or go directly to the emergency room. Call with any questions/concerns.  Celesta Gentile, DPM

## 2018-12-27 ENCOUNTER — Other Ambulatory Visit: Payer: Self-pay | Admitting: Internal Medicine

## 2018-12-27 DIAGNOSIS — Z1231 Encounter for screening mammogram for malignant neoplasm of breast: Secondary | ICD-10-CM

## 2019-01-12 ENCOUNTER — Encounter (HOSPITAL_COMMUNITY): Payer: Self-pay | Admitting: Emergency Medicine

## 2019-01-12 ENCOUNTER — Emergency Department (HOSPITAL_COMMUNITY): Payer: Medicare Other

## 2019-01-12 ENCOUNTER — Other Ambulatory Visit: Payer: Self-pay

## 2019-01-12 ENCOUNTER — Observation Stay (HOSPITAL_COMMUNITY)
Admission: EM | Admit: 2019-01-12 | Discharge: 2019-01-13 | Disposition: A | Discharge status: Home / Self Care | Payer: Medicare Other | Attending: Cardiovascular Disease | Admitting: Cardiovascular Disease

## 2019-01-12 DIAGNOSIS — I10 Essential (primary) hypertension: Secondary | ICD-10-CM | POA: Diagnosis not present

## 2019-01-12 DIAGNOSIS — Z79899 Other long term (current) drug therapy: Secondary | ICD-10-CM | POA: Insufficient documentation

## 2019-01-12 DIAGNOSIS — R079 Chest pain, unspecified: Principal | ICD-10-CM | POA: Insufficient documentation

## 2019-01-12 DIAGNOSIS — I2510 Atherosclerotic heart disease of native coronary artery without angina pectoris: Secondary | ICD-10-CM | POA: Insufficient documentation

## 2019-01-12 DIAGNOSIS — R072 Precordial pain: Secondary | ICD-10-CM

## 2019-01-12 DIAGNOSIS — Z7982 Long term (current) use of aspirin: Secondary | ICD-10-CM | POA: Diagnosis not present

## 2019-01-12 LAB — COMPREHENSIVE METABOLIC PANEL
ALBUMIN: 3.9 g/dL (ref 3.5–5.0)
ALK PHOS: 43 U/L (ref 38–126)
ALT: 24 U/L (ref 0–44)
AST: 27 U/L (ref 15–41)
Anion gap: 12 (ref 5–15)
BUN: 12 mg/dL (ref 8–23)
CHLORIDE: 107 mmol/L (ref 98–111)
CO2: 23 mmol/L (ref 22–32)
CREATININE: 1 mg/dL (ref 0.44–1.00)
Calcium: 9.4 mg/dL (ref 8.9–10.3)
GFR calc Af Amer: >60 mL/min (ref >60)
GFR calc non Af Amer: 58 mL/min — ABNORMAL LOW (ref >60)
Glucose, Bld: 123 mg/dL — ABNORMAL HIGH (ref 70–99)
POTASSIUM: 3.4 mmol/L — AB (ref 3.5–5.1)
SODIUM: 142 mmol/L (ref 135–145)
Total Bilirubin: 0.9 mg/dL (ref 0.3–1.2)
Total Protein: 7.2 g/dL (ref 6.5–8.1)

## 2019-01-12 LAB — CBC WITH DIFFERENTIAL/PLATELET
Abs Immature Granulocytes: 0.07 10*3/uL (ref 0.00–0.07)
BASOS ABS: 0.1 10*3/uL (ref 0.0–0.1)
Basophils Relative: 1 %
EOS PCT: 1 %
Eosinophils Absolute: 0.1 10*3/uL (ref 0.0–0.5)
HEMATOCRIT: 41 % (ref 36.0–46.0)
HEMOGLOBIN: 13 g/dL (ref 12.0–15.0)
Immature Granulocytes: 1 %
LYMPHS PCT: 26 %
Lymphs Abs: 2.2 10*3/uL (ref 0.7–4.0)
MCH: 28.4 pg (ref 26.0–34.0)
MCHC: 31.7 g/dL (ref 30.0–36.0)
MCV: 89.5 fL (ref 80.0–100.0)
MONO ABS: 0.7 10*3/uL (ref 0.1–1.0)
Monocytes Relative: 9 %
NRBC: 0 % (ref 0.0–0.2)
Neutro Abs: 5.3 10*3/uL (ref 1.7–7.7)
Neutrophils Relative %: 62 %
Platelets: 292 10*3/uL (ref 150–400)
RBC: 4.58 MIL/uL (ref 3.87–5.11)
RDW: 14 % (ref 11.5–15.5)
WBC: 8.4 10*3/uL (ref 4.0–10.5)

## 2019-01-12 LAB — I-STAT TROPONIN, ED
TROPONIN I, POC: 0 ng/mL (ref 0.00–0.08)
Troponin i, poc: 0 ng/mL (ref 0.00–0.08)

## 2019-01-12 LAB — D-DIMER, QUANTITATIVE: D-Dimer, Quant: 0.55 ug/mL-FEU — ABNORMAL HIGH (ref 0.00–0.50)

## 2019-01-12 LAB — LIPASE, BLOOD: LIPASE: 22 U/L (ref 11–51)

## 2019-01-12 MED ORDER — ENOXAPARIN SODIUM 40 MG/0.4ML ~~LOC~~ SOLN
40.0000 mg | SUBCUTANEOUS | Status: DC
  Administered 2019-01-12: 40 mg via SUBCUTANEOUS
  Filled 2019-01-12: qty 0.4

## 2019-01-12 MED ORDER — CYCLOBENZAPRINE HCL 10 MG PO TABS: 10.0000 mg | ORAL_TABLET | Freq: Two times a day (BID) | ORAL | Status: DC | PRN

## 2019-01-12 MED ORDER — ADULT MULTIVITAMIN W/MINERALS CH
1.0000 | ORAL_TABLET | Freq: Every day | ORAL | Status: DC
  Administered 2019-01-13: 1 via ORAL
  Filled 2019-01-12 (×2): qty 1

## 2019-01-12 MED ORDER — IOPAMIDOL (ISOVUE-370) INJECTION 76%
INTRAVENOUS | Status: AC
  Filled 2019-01-12: qty 100

## 2019-01-12 MED ORDER — ADULT GUMMY PO CHEW: 2.0000 | CHEWABLE_TABLET | Freq: Every day | ORAL | Status: DC

## 2019-01-12 MED ORDER — AMITRIPTYLINE HCL 25 MG PO TABS
25.0000 mg | ORAL_TABLET | Freq: Every day | ORAL | Status: DC
  Administered 2019-01-12: 25 mg via ORAL
  Filled 2019-01-12: qty 1

## 2019-01-12 MED ORDER — SODIUM CHLORIDE 0.9% FLUSH: 3.0000 mL | INTRAVENOUS | Status: DC | PRN

## 2019-01-12 MED ORDER — LISINOPRIL 20 MG PO TABS
20.0000 mg | ORAL_TABLET | Freq: Every day | ORAL | Status: DC
  Administered 2019-01-12: 20 mg via ORAL
  Filled 2019-01-12: qty 1

## 2019-01-12 MED ORDER — HYDROCHLOROTHIAZIDE 25 MG PO TABS
25.0000 mg | ORAL_TABLET | Freq: Every day | ORAL | Status: DC
  Administered 2019-01-13: 25 mg via ORAL
  Filled 2019-01-12: qty 1

## 2019-01-12 MED ORDER — SODIUM CHLORIDE 0.9% FLUSH
3.0000 mL | Freq: Two times a day (BID) | INTRAVENOUS | Status: DC
  Administered 2019-01-12 – 2019-01-13 (×2): 3 mL via INTRAVENOUS

## 2019-01-12 MED ORDER — METOPROLOL TARTRATE 12.5 MG HALF TABLET
12.5000 mg | ORAL_TABLET | Freq: Two times a day (BID) | ORAL | Status: DC
  Administered 2019-01-13: 12.5 mg via ORAL
  Filled 2019-01-12 (×3): qty 1

## 2019-01-12 MED ORDER — POTASSIUM CHLORIDE CRYS ER 20 MEQ PO TBCR
40.0000 meq | EXTENDED_RELEASE_TABLET | Freq: Once | ORAL | Status: AC
  Administered 2019-01-12: 40 meq via ORAL
  Filled 2019-01-12: qty 2

## 2019-01-12 MED ORDER — SODIUM CHLORIDE 0.9 % IV SOLN: 250.0000 mL | INTRAVENOUS | Status: DC | PRN

## 2019-01-12 MED ORDER — PRAVASTATIN SODIUM 40 MG PO TABS
40.0000 mg | ORAL_TABLET | Freq: Every evening | ORAL | Status: DC
  Administered 2019-01-12: 40 mg via ORAL
  Filled 2019-01-12: qty 1

## 2019-01-12 MED ORDER — IOPAMIDOL (ISOVUE-370) INJECTION 76%
100.0000 mL | Freq: Once | INTRAVENOUS | Status: AC | PRN
  Administered 2019-01-12: 100 mL via INTRAVENOUS

## 2019-01-12 MED ORDER — NITROGLYCERIN 0.4 MG SL SUBL: 0.4000 mg | SUBLINGUAL_TABLET | SUBLINGUAL | Status: DC | PRN

## 2019-01-12 MED ORDER — ASPIRIN EC 81 MG PO TBEC
81.0000 mg | DELAYED_RELEASE_TABLET | Freq: Every morning | ORAL | Status: DC
  Administered 2019-01-13: 81 mg via ORAL
  Filled 2019-01-12: qty 1

## 2019-01-12 MED ORDER — ACETAMINOPHEN 325 MG PO TABS
650.0000 mg | ORAL_TABLET | ORAL | Status: DC | PRN
  Administered 2019-01-13 (×2): 650 mg via ORAL
  Filled 2019-01-12 (×2): qty 2

## 2019-01-12 MED ORDER — ONDANSETRON HCL 4 MG/2ML IJ SOLN: 4.0000 mg | Freq: Four times a day (QID) | INTRAMUSCULAR | Status: DC | PRN

## 2019-01-12 MED ORDER — IBUPROFEN 200 MG PO TABS: 400.0000 mg | ORAL_TABLET | Freq: Four times a day (QID) | ORAL | Status: DC | PRN

## 2019-01-12 NOTE — ED Provider Notes (Signed)
[facility] EMERGENCY DEPARTMENT Provider Note   CSN: [id number] Arrival date & time: 01/12/19  1040     History   Chief Complaint Chief Complaint  Patient presents with  . Chest Pain    HPI Krystal Gates is a 68 y.o. female.    The history is provided by the patient, a relative, medical records and the EMS personnel.  Chest Pain  Pain location:  Substernal area and L chest Pain quality: crushing and pressure   Pain radiates to:  L shoulder and L arm Pain severity:  Severe Onset quality:  Sudden Duration:  1 hour Timing:  Constant Progression:  Improving Chronicity:  New Context: breathing and at rest   Relieved by:  Nitroglycerin Worsened by:  Deep breathing and exertion Associated symptoms: diaphoresis and shortness of breath   Associated symptoms: no abdominal pain, no back pain, no claudication, no cough, no dizziness, no fatigue, no fever, no lower extremity edema, no nausea, no near-syncope, no palpitations, no syncope, no vomiting and no weakness   Risk factors: coronary artery disease     Past Medical History:  Diagnosis Date  . Arthritis   . Heart disease    "40% heart blockage, dx 20 years ago"per pt. NO treatment.  . Hyperlipidemia   . Hypertension   . Sciatica     Patient Active Problem List   Diagnosis Date Noted  . Ingrown toenail 02/04/2018  . HYPERTENSION, BENIGN SYSTEMIC 01/24/2007  . CORONARY, ARTERIOSCLEROSIS 01/24/2007  . BRADYCARDIA 01/24/2007  . INSOMNIA NOS 01/24/2007    Past Surgical History:  Procedure Laterality Date  . ABDOMINAL HYSTERECTOMY    . carpel tunnel Bilateral   . ECTOPIC PREGNANCY SURGERY    . rectal fissure       OB History   No obstetric history on file.      Home Medications    Prior to Admission medications   Medication Sig Start Date End Date Taking? Authorizing Provider  amitriptyline (ELAVIL) 25 MG tablet Take 25 mg by mouth daily.  01/10/18   [provider]    aspirin EC 81 MG tablet Take 81 mg by mouth every morning.    [provider]  cyclobenzaprine (FLEXERIL) 10 MG tablet Take 1 tablet (10 mg total) by mouth 2 (two) times daily as needed for muscle spasms. 04/15/13   Domenic Moras, PA-C  hydrochlorothiazide (HYDRODIURIL) 25 MG tablet Take 25 mg by mouth daily.    [provider]  lisinopril (PRINIVIL,ZESTRIL) 20 MG tablet Take 20 mg by mouth every morning.    [provider]  Multiple Vitamins-Minerals (MULTIVITAMIN PO) Take by mouth. Women's Adult Gummy Multivitamin - takes 2 gummies a day    [provider]  pravastatin (PRAVACHOL) 40 MG tablet Take 40 mg by mouth every evening.    [provider]    Family History Family History  Adopted: Yes    Social History Social History   Tobacco Use  . Smoking status: Never Smoker  . Smokeless tobacco: Never Used  Substance Use Topics  . Alcohol use: No  . Drug use: No     Allergies   Codeine; Penicillins; Talwin [pentazocine]; Tylox [oxycodone-acetaminophen]; and Zithromax [azithromycin]   Review of Systems Review of Systems  Constitutional: Positive for diaphoresis. Negative for chills, fatigue and fever.  HENT: Negative for congestion and rhinorrhea.   Eyes: Negative for visual disturbance.  Respiratory: Positive for chest tightness and shortness of breath. Negative for cough, wheezing and stridor.  Cardiovascular: Positive for chest pain. Negative for palpitations, claudication, leg swelling, syncope and near-syncope.  Gastrointestinal: Negative for abdominal pain, constipation, diarrhea, nausea and vomiting.  Genitourinary: Negative for dysuria.  Musculoskeletal: Negative for back pain, neck pain and neck stiffness.  Skin: Negative for rash.  Neurological: Positive for light-headedness. Negative for dizziness, facial asymmetry and weakness.  Psychiatric/Behavioral: Negative for agitation.     Physical Exam Updated Vital Signs BP  129/82 (BP Location: Left Arm)   Pulse (!) 107   Temp 98.3 F (36.8 C) (Oral)   Resp (!) 23   Ht 5\' 3"  (1.6 m)   Wt 87.1 kg   SpO2 99%   BMI 34.01 kg/m   Physical Exam Vitals signs and nursing note reviewed.  Constitutional:      General: She is not in acute distress.    Appearance: She is well-developed. She is not ill-appearing, toxic-appearing or diaphoretic.  HENT:     Head: Normocephalic and atraumatic.     Right Ear: External ear normal.     Left Ear: External ear normal.     Nose: Nose normal.     Mouth/Throat:     Pharynx: No oropharyngeal exudate.  Eyes:     Conjunctiva/sclera: Conjunctivae normal.     Pupils: Pupils are equal, round, and reactive to light.  Neck:     Musculoskeletal: Normal range of motion and neck supple.  Cardiovascular:     Rate and Rhythm: Tachycardia present.     Heart sounds: Heart sounds not distant. No murmur.  Pulmonary:     Effort: Pulmonary effort is normal. Tachypnea present. No respiratory distress.     Breath sounds: No stridor. No decreased breath sounds, wheezing, rhonchi or rales.  Chest:     Chest wall: No tenderness.  Abdominal:     General: There is no distension.     Tenderness: There is no abdominal tenderness. There is no rebound.  Musculoskeletal:     Right lower leg: She exhibits no tenderness. No edema.     Left lower leg: She exhibits no tenderness. No edema.  Skin:    General: Skin is warm.     Capillary Refill: Capillary refill takes less than 2 seconds.     Findings: No erythema or rash.  Neurological:     General: No focal deficit present.     Mental Status: She is alert and oriented to person, place, and time.     Motor: No abnormal muscle tone.     Coordination: Coordination normal.     Deep Tendon Reflexes: Reflexes are normal and symmetric.      ED Treatments / Results  Labs (all labs ordered are listed, but only abnormal results are displayed) Labs Reviewed  COMPREHENSIVE METABOLIC PANEL -  Abnormal; Notable for the following components:      Result Value   Potassium 3.4 (*)    Glucose, Bld 123 (*)    GFR calc non Af Amer 58 (*)    All other components within normal limits  D-DIMER, QUANTITATIVE (NOT AT [facility]) - Abnormal; Notable for the following components:   D-Dimer, Quant 0.55 (*)    All other components within normal limits  CBC WITH DIFFERENTIAL/PLATELET  LIPASE, BLOOD  TROPONIN I  TROPONIN I  HIV ANTIBODY (ROUTINE TESTING W REFLEX)  I-STAT TROPONIN, ED  I-STAT TROPONIN, ED    EKG EKG Interpretation  Date/Time:  Sunday January 12 2019 10:45:51 EST Ventricular Rate:  100 PR Interval:    QRS Duration:  81 QT Interval:  370 QTC Calculation: 478 R Axis:   3 Text Interpretation:  Sinus tachycardia Probable left atrial enlargement Baseline wander in lead(s) II III aVF V1 V2 When cpmpared to prior, no significant changes seen,  No STEMI Confirmed by Antony Blackbird [phone]) on 01/12/2019 10:53:12 AM   Radiology Ct Angio Chest Pe W And/or Wo Contrast  Result Date: 01/12/2019 CLINICAL DATA:  Severe left-sided chest pain extending into the left arm with acute onset this morning. Chest pressure and shortness of breath. Intermediate probability for pulmonary embolism. EXAM: CT ANGIOGRAPHY CHEST WITH CONTRAST TECHNIQUE: Multidetector CT imaging of the chest was performed using the standard protocol during bolus administration of intravenous contrast. Multiplanar CT image reconstructions and MIPs were obtained to evaluate the vascular anatomy. CONTRAST:  174mL ISOVUE-370 IOPAMIDOL (ISOVUE-370) INJECTION 76% COMPARISON:  Chest radiographs 02/15/2007. FINDINGS: Cardiovascular: The pulmonary arteries are well opacified with contrast to the level of the subsegmental branches. There is no evidence of acute pulmonary embolism. No significant systemic arterial abnormalities identified. The heart size is normal. There is no pericardial effusion. Mediastinum/Nodes: There are no enlarged  mediastinal, hilar or axillary lymph nodes. The thyroid gland, trachea and esophagus demonstrate no significant findings. Lungs/Pleura: There is no pleural effusion or pneumothorax. Linear atelectasis or scarring is present in both lower lobes. The lungs are otherwise clear. Upper abdomen: Severe hepatic steatosis. No acute findings identified. Musculoskeletal/Chest wall: There is no chest wall mass or suspicious osseous finding. Mild thoracic spondylosis. Review of the MIP images confirms the above findings. IMPRESSION: 1. No evidence of acute pulmonary embolism or other acute chest process. 2. Mild linear atelectasis or scarring at both lung bases. 3. Severe hepatic steatosis. Electronically Signed   By: Richardean Sale M.D.   On: 01/12/2019 13:13    Procedures Procedures (including critical care time)  Medications Ordered in ED Medications  iopamidol (ISOVUE-370) 76 % injection (has no administration in time range)  aspirin EC tablet 81 mg (has no administration in time range)  ibuprofen (ADVIL,MOTRIN) tablet 400 mg (has no administration in time range)  hydrochlorothiazide (HYDRODIURIL) tablet 25 mg (25 mg Oral Not Given 01/12/19 1640)  lisinopril (PRINIVIL,ZESTRIL) tablet 20 mg (has no administration in time range)  pravastatin (PRAVACHOL) tablet 40 mg (has no administration in time range)  amitriptyline (ELAVIL) tablet 25 mg (has no administration in time range)  cyclobenzaprine (FLEXERIL) tablet 10 mg (has no administration in time range)  nitroGLYCERIN (NITROSTAT) SL tablet 0.4 mg (has no administration in time range)  acetaminophen (TYLENOL) tablet 650 mg (has no administration in time range)  ondansetron (ZOFRAN) injection 4 mg (has no administration in time range)  enoxaparin (LOVENOX) injection 40 mg (has no administration in time range)  sodium chloride flush (NS) 0.9 % injection 3 mL (has no administration in time range)  sodium chloride flush (NS) 0.9 % injection 3 mL (has no  administration in time range)  0.9 %  sodium chloride infusion (has no administration in time range)  metoprolol tartrate (LOPRESSOR) tablet 12.5 mg (has no administration in time range)  potassium chloride SA (K-DUR,KLOR-CON) CR tablet 40 mEq (has no administration in time range)  multivitamin with minerals tablet 1 tablet (has no administration in time range)  iopamidol (ISOVUE-370) 76 % injection 100 mL (100 mLs Intravenous Contrast Given 01/12/19 1202)     Initial Impression / Assessment and Plan / ED Course  I have reviewed the triage vital signs and the nursing notes.  Pertinent labs & imaging results that  were available during my care of the patient were reviewed by me and considered in my medical decision making (see chart for details).     Hazle Ogburn is a 68 y.o. female with a past medical history significant for coronary arteriosclerosis, hypertension, and arthritis who presents with chest pain and shortness of breath.  Patient reports that years ago she had similar pressure-like chest pain and was told she had a partial blockage of her heart that was 40%.  They reported that she did not need a stent at that time.  Patient was medically managed and has done well for years.  She reports that today while at church, approximately 1 hour ago, patient had sudden onset of severe chest pressure.  She reports that it radiates into her left arm and left shoulder.  She reports diaphoresis.  No nausea or vomiting.  Patient felt lightheaded but did not syncopized.  She reported shortness of breath and that the pain was worsened both with exertion and with deep breathing.  Very pleuritic.  She denies recent trauma.  Denies recent medication changes.  Denies leg pain or leg swelling.  No history of DVT or PE.  Patient is adopted and does not know family history of heart disease.  On arrival, patient is slightly tachycardic and tachypneic.  Oxygen saturations are normal on room air.  Patient has  improvement in diaphoresis.  Patient was given 324 of aspirin with EMS as well as 1 nitroglycerin.  Chest pain slightly improved but patient developed a headache.  Will hold on further nitroglycerin at this time.  On exam, lungs are clear.  Chest is nontender.  Abdomen is nontender.  Symmetric radial pulses.  Minimal lower extremity edema with normal sensation in the legs.  Patient had otherwise unremarkable exam.  EKG shows no STEMI.  Clinically I am concerned about pulmonary realism versus cardiac cause of her pain at this time.  Patient will have chest x-ray, troponin, and a d-dimer.  She will have other screening labs.  Anticipate Ct PE scan if dimer is positive however I anticipate we will speak with cardiology due to history of partial coronary blockages and similar chest pain at this time.      11:40 AM D-dimer is positive, CT PE study was ordered to be completed if her kidney function can tolerate contrast.  Patient reports her pain is improved and vital signs remained stable while awaiting further work-up.  PE study shows no pneumonia or pulmonary embolism.  Cardiology was called who will see patient.  Patient admitted to cardiology service for further monitoring and management.     Final Clinical Impressions(s) / ED Diagnoses   Final diagnoses:  Precordial pain    ED Discharge Orders    None      Clinical Impression: 1. Precordial pain     Disposition: Admit  This note was prepared with assistance of Dragon voice recognition software. Occasional wrong-word or sound-a-like substitutions may have occurred due to the inherent limitations of voice recognition software.     Tegeler, Gwenyth Allegra, MD 01/12/19 [phone]

## 2019-01-12 NOTE — ED Notes (Addendum)
Pt returned from CT °

## 2019-01-12 NOTE — Plan of Care (Signed)
  Problem: Education: Goal: Knowledge of General Education information will improve Description Including pain rating scale, medication(s)/side effects and non-pharmacologic comfort measures Outcome: Progressing   Problem: Health Behavior/Discharge Planning: Goal: Ability to manage health-related needs will improve Outcome: Progressing   Problem: Clinical Measurements: Goal: Ability to maintain clinical measurements within normal limits will improve Outcome: Progressing Goal: Will remain free from infection Outcome: Progressing Goal: Diagnostic test results will improve Outcome: Progressing Goal: Cardiovascular complication will be avoided Outcome: Progressing   Problem: Activity: Goal: Risk for activity intolerance will decrease Outcome: Progressing   Problem: Clinical Measurements: Goal: Respiratory complications will improve Outcome: Completed/Met

## 2019-01-12 NOTE — H&P (Signed)
Physician History and Physical     Patient ID: Krystal Gates MRN: [id number] DOB/AGE: 1951/09/22 68 y.o. Admit date: 01/12/2019  Primary Care Physician: Nolene Ebbs, MD Primary Cardiologist: New/  Active Problems:   * No active hospital problems. *   HPI:  68 y.o. history of HLD and HTN.  Cath years ago ? 40% blockage Does not recall cardiologist. Been well Until this am at church Had just finished dancing and had sudden onset severe SSCP Accompanied by dyspnea Radiated to left arm. Lasted a couple hours total Not pleuritic or positional ? Nitro helped a bit then spontaneously resolved Currently pain free. ECG normal Enzymes negative CT negative for PE or dissection. Prior to this has been fine does all ADL's and walks. Otherwise pretty sedentary according to sons Compliant with meds Denies smoking or DM.  Able to walk on treadmill   Review of systems complete and found to be negative unless listed above   Past Medical History:  Diagnosis Date  . Arthritis   . Heart disease    "40% heart blockage, dx 20 years ago"per pt. NO treatment.  . Hyperlipidemia   . Hypertension   . Sciatica     Family History  Adopted: Yes    Social History   Socioeconomic History  . Marital status: Widowed    Spouse name: Not on file  . Number of children: Not on file  . Years of education: Not on file  . Highest education level: Not on file  Occupational History  . Not on file  Social Needs  . Financial resource strain: Not on file  . Food insecurity:    Worry: Not on file    Inability: Not on file  . Transportation needs:    Medical: Not on file    Non-medical: Not on file  Tobacco Use  . Smoking status: Never Smoker  . Smokeless tobacco: Never Used  Substance and Sexual Activity  . Alcohol use: No  . Drug use: No  . Sexual activity: Never  Lifestyle  . Physical activity:    Days per week: Not on file    Minutes per session: Not on file  . Stress: Not on file    Relationships  . Social connections:    Talks on phone: Not on file    Gets together: Not on file    Attends religious service: Not on file    Active member of club or organization: Not on file    Attends meetings of clubs or organizations: Not on file    Relationship status: Not on file  . Intimate partner violence:    Fear of current or ex partner: Not on file    Emotionally abused: Not on file    Physically abused: Not on file    Forced sexual activity: Not on file  Other Topics Concern  . Not on file  Social History Narrative  . Not on file    Past Surgical History:  Procedure Laterality Date  . ABDOMINAL HYSTERECTOMY    . carpel tunnel Bilateral   . ECTOPIC PREGNANCY SURGERY    . rectal fissure       (Not in a hospital admission)   Physical Exam: Blood pressure 126/76, pulse 91, temperature 98.3 F (36.8 C), temperature source Oral, resp. rate 19, height 5\' 3"  (1.6 m), weight 87.1 kg, SpO2 98 %.    Affect appropriate Obese black female  HEENT: normal Neck supple with no adenopathy JVP normal no bruits no thyromegaly  Lungs clear with no wheezing and good diaphragmatic motion Heart:  S1/S2 no murmur, no rub, gallop or click PMI normal Abdomen: benighn, BS positve, no tenderness, no AAA no bruit.  No HSM or HJR Distal pulses intact with no bruits No edema Neuro non-focal Skin warm and dry No muscular weakness  No current facility-administered medications on file prior to encounter.    Current Outpatient Medications on File Prior to Encounter  Medication Sig Dispense Refill  . amitriptyline (ELAVIL) 25 MG tablet Take 25 mg by mouth at bedtime.     Marland Kitchen aspirin EC 81 MG tablet Take 81 mg by mouth every morning.    . cyclobenzaprine (FLEXERIL) 10 MG tablet Take 1 tablet (10 mg total) by mouth 2 (two) times daily as needed for muscle spasms. 20 tablet 0  . hydrochlorothiazide (HYDRODIURIL) 25 MG tablet Take 25 mg by mouth daily.    Marland Kitchen ibuprofen (ADVIL,MOTRIN) 200  MG tablet Take 400 mg by mouth every 6 (six) hours as needed (for pain or headaches).    Marland Kitchen lisinopril (PRINIVIL,ZESTRIL) 20 MG tablet Take 20 mg by mouth at bedtime.     . Multiple Vitamins-Minerals (ADULT GUMMY) CHEW Chew 2 tablets by mouth daily.    . pravastatin (PRAVACHOL) 40 MG tablet Take 40 mg by mouth every evening.      Labs:   Lab Results  Component Value Date   WBC 8.4 01/12/2019   HGB 13.0 01/12/2019   HCT 41.0 01/12/2019   MCV 89.5 01/12/2019   PLT 292 01/12/2019    Recent Labs  Lab 01/12/19 1052  NA 142  K 3.4*  CL 107  CO2 23  BUN 12  CREATININE 1.00  CALCIUM 9.4  PROT 7.2  BILITOT 0.9  ALKPHOS 43  ALT 24  AST 27  GLUCOSE 123*   No results found for: CKTOTAL, CKMB, CKMBINDEX, TROPONINI   Lab Results  Component Value Date   CHOL 179 03/21/2010   CHOL 186 05/04/2008   CHOL 230 (H) 08/12/2007   Lab Results  Component Value Date   HDL 69 03/21/2010   HDL 74 05/04/2008   HDL 75 08/12/2007   Lab Results  Component Value Date   LDLCALC 94 03/21/2010   LDLCALC 99 05/04/2008   LDLCALC 133 (H) 08/12/2007   Lab Results  Component Value Date   TRIG 80 03/21/2010   TRIG 64 05/04/2008   TRIG 109 08/12/2007   Lab Results  Component Value Date   CHOLHDL 2.6 Ratio 03/21/2010   CHOLHDL 2.5 Ratio 05/04/2008   CHOLHDL 3.1 Ratio 08/12/2007   No results found for: LDLDIRECT     Radiology: Ct Angio Chest Pe W And/or Wo Contrast  Result Date: 01/12/2019 CLINICAL DATA:  Severe left-sided chest pain extending into the left arm with acute onset this morning. Chest pressure and shortness of breath. Intermediate probability for pulmonary embolism. EXAM: CT ANGIOGRAPHY CHEST WITH CONTRAST TECHNIQUE: Multidetector CT imaging of the chest was performed using the standard protocol during bolus administration of intravenous contrast. Multiplanar CT image reconstructions and MIPs were obtained to evaluate the vascular anatomy. CONTRAST:  142mL ISOVUE-370 IOPAMIDOL  (ISOVUE-370) INJECTION 76% COMPARISON:  Chest radiographs 02/15/2007. FINDINGS: Cardiovascular: The pulmonary arteries are well opacified with contrast to the level of the subsegmental branches. There is no evidence of acute pulmonary embolism. No significant systemic arterial abnormalities identified. The heart size is normal. There is no pericardial effusion. Mediastinum/Nodes: There are no enlarged mediastinal, hilar or axillary lymph nodes. The thyroid  gland, trachea and esophagus demonstrate no significant findings. Lungs/Pleura: There is no pleural effusion or pneumothorax. Linear atelectasis or scarring is present in both lower lobes. The lungs are otherwise clear. Upper abdomen: Severe hepatic steatosis. No acute findings identified. Musculoskeletal/Chest wall: There is no chest wall mass or suspicious osseous finding. Mild thoracic spondylosis. Review of the MIP images confirms the above findings. IMPRESSION: 1. No evidence of acute pulmonary embolism or other acute chest process. 2. Mild linear atelectasis or scarring at both lung bases. 3. Severe hepatic steatosis. Electronically Signed   By: Richardean Sale M.D.   On: 01/12/2019 13:13    EKG: NSR normal   ASSESSMENT AND PLAN:  Chest Pain: CRF;s HTN and HLD.  Cath years ago with no obstructive disease Normal ECG negative enzymes and negative CT for dissection / PE. Admit cycle enzymes and ECG If no recurrence exercise myovue in am   HTN; continue home meds supplement K  HLD:  Continue statin f/u labs with primary    Signed: Collier Salina Nishan2/16/2020, 3:08 PM

## 2019-01-12 NOTE — ED Triage Notes (Signed)
Per [facility] pt picked up from church due to chest pain and diaphoresis that started while she was dancing. Pt denies cardiac hx 4 baby asa and 1 nitro given PTA

## 2019-01-13 ENCOUNTER — Other Ambulatory Visit: Payer: Self-pay | Admitting: Physician Assistant

## 2019-01-13 ENCOUNTER — Observation Stay (HOSPITAL_COMMUNITY): Payer: Medicare Other

## 2019-01-13 ENCOUNTER — Encounter (HOSPITAL_COMMUNITY): Payer: Self-pay | Admitting: Physician Assistant

## 2019-01-13 DIAGNOSIS — R079 Chest pain, unspecified: Secondary | ICD-10-CM

## 2019-01-13 DIAGNOSIS — Z79899 Other long term (current) drug therapy: Secondary | ICD-10-CM | POA: Diagnosis not present

## 2019-01-13 DIAGNOSIS — I10 Essential (primary) hypertension: Secondary | ICD-10-CM | POA: Diagnosis not present

## 2019-01-13 DIAGNOSIS — I2510 Atherosclerotic heart disease of native coronary artery without angina pectoris: Secondary | ICD-10-CM | POA: Diagnosis not present

## 2019-01-13 LAB — NM MYOCAR MULTI W/SPECT W/WALL MOTION / EF
CSEPEDS: 0 s
CSEPHR: 92 %
Estimated workload: 5.8 METS
Exercise duration (min): 4 min
MPHR: 153 {beats}/min
Peak HR: 142 {beats}/min
RPE: 18
Rest HR: 71 {beats}/min

## 2019-01-13 LAB — TROPONIN I
Troponin I: <0.03 ng/mL (ref <0.03)
Troponin I: <0.03 ng/mL (ref <0.03)

## 2019-01-13 LAB — BASIC METABOLIC PANEL
Anion gap: 11 (ref 5–15)
BUN: 13 mg/dL (ref 8–23)
CO2: 23 mmol/L (ref 22–32)
Calcium: 8.9 mg/dL (ref 8.9–10.3)
Chloride: 105 mmol/L (ref 98–111)
Creatinine, Ser: 0.92 mg/dL (ref 0.44–1.00)
GFR calc Af Amer: >60 mL/min (ref >60)
GFR calc non Af Amer: >60 mL/min (ref >60)
GLUCOSE: 109 mg/dL — AB (ref 70–99)
Potassium: 3.7 mmol/L (ref 3.5–5.1)
Sodium: 139 mmol/L (ref 135–145)

## 2019-01-13 LAB — LIPID PANEL
Cholesterol: 140 mg/dL (ref 0–200)
HDL: 42 mg/dL (ref >40)
LDL CALC: 83 mg/dL (ref 0–99)
Total CHOL/HDL Ratio: 3.3 RATIO
Triglycerides: 73 mg/dL (ref <150)
VLDL: 15 mg/dL (ref 0–40)

## 2019-01-13 LAB — HIV ANTIBODY (ROUTINE TESTING W REFLEX): HIV Screen 4th Generation wRfx: NONREACTIVE

## 2019-01-13 MED ORDER — TECHNETIUM TC 99M TETROFOSMIN IV KIT
10.0000 | PACK | Freq: Once | INTRAVENOUS | Status: AC | PRN
  Administered 2019-01-13: 10 via INTRAVENOUS

## 2019-01-13 MED ORDER — METOPROLOL TARTRATE 25 MG PO TABS: 12.5000 mg | ORAL_TABLET | Freq: Two times a day (BID) | ORAL | 3 refills | Status: DC

## 2019-01-13 MED ORDER — NITROGLYCERIN 0.4 MG SL SUBL: 0.4000 mg | SUBLINGUAL_TABLET | SUBLINGUAL | 12 refills | Status: DC | PRN

## 2019-01-13 MED ORDER — TECHNETIUM TC 99M TETROFOSMIN IV KIT
30.0000 | PACK | Freq: Once | INTRAVENOUS | Status: AC | PRN
  Administered 2019-01-13: 30 via INTRAVENOUS

## 2019-01-13 NOTE — Discharge Summary (Addendum)
Discharge Summary    Patient ID: Krystal Gates,  MRN: [id number], DOB/AGE: June 24, 1951 68 y.o.  Admit date: 01/12/2019 Discharge date: 01/13/2019  Primary Care Provider: Nolene Ebbs Primary Cardiologist: Jenkins Rouge, MD   Discharge Diagnoses    Active Problems:   Chest pain syndrome   Allergies Allergies  Allergen Reactions  . Codeine Nausea Only  . Penicillins Hives    Has patient had a PCN reaction causing immediate rash, facial/tongue/throat swelling, SOB or lightheadedness with hypotension: Yes Has patient had a PCN reaction causing severe rash involving mucus membranes or skin necrosis: Hives Has patient had a PCN reaction that required hospitalization: No Has patient had a PCN reaction occurring within the last 10 years: No If all of the above answers are "NO", then may proceed with Cephalosporin use.   Loma Messing [Pentazocine] Nausea Only  . Tylox [Oxycodone-Acetaminophen] Nausea Only  . Zithromax [Azithromycin] Other (See Comments)    Headache    Diagnostic Studies/Procedures    MYOVIEW: 01/13/2019  Blood pressure demonstrated a normal response to exercise.  No T wave inversion was noted during stress.  Upsloping ST segment depression ST segment depression was noted during stress in the V5, V6, III and aVF leads.  Defect 1: There is a medium defect of severe severity present in the basal inferoseptal, mid inferoseptal, apical septal and apex location.  Findings consistent with prior myocardial infarction vs artifact.  This is an intermediate risk study.  Nuclear stress EF: 59%.  The left ventricular ejection fraction is normal (55-65%).   Fixed perfusion defect in the apical septum and inferoseptum at mid and base. Normal EF. Nonischemic ECG however significant artifact limits ECG interpretation.  Below average exercise capacity, no chest pain with stress.   Per Dr Harrington Challenger: 01/13/2019 REviewed myovue   No evidence  for ischemia   ? Artifact,  less likely scar in inferoseptal portion  Of heart    LVEF is normal  Recomm: OK to d/c   Not clearly cardiac CP    Will arrange for f/u echo as outpt and f/u with me in clinic   _____________   History of Present Illness     68 yo female w/ hx HLD, HTN, 30-50% LAD at cath 2004, was admitted 02/16 with chest pain, initial ez neg.  Hospital Course     Consultants: None   His enzymes remained negative, ECG was not acute.   He had treadmill MV on 01/13/2019, tolerated it well. He reached 92% of his predicted max HR, possible fixed defect vs artifact, but EF 59% and Dr Harrington Challenger reviewed the study, felt it was low risk. She is to get an echo and follow up as an outpt.  No further inpatient workup indicated and she is considered stable for discharge, to follow up as and outpt.  _____________  Discharge Vitals Blood pressure 108/66, pulse 77, temperature 98.4 F (36.9 C), temperature source Oral, resp. rate 20, height 5\' 3"  (1.6 m), weight 87.2 kg, SpO2 100 %.  Filed Weights   01/12/19 1044 01/12/19 1621 01/13/19 0534  Weight: 87.1 kg 87 kg 87.2 kg    Labs & Radiologic Studies    CBC Recent Labs    01/12/19 1052  WBC 8.4  NEUTROABS 5.3  HGB 13.0  HCT 41.0  MCV 89.5  PLT 096   Basic Metabolic Panel Recent Labs    01/12/19 1052 01/13/19 0729  NA 142 139  K 3.4* 3.7  CL 107 105  CO2 23  23  GLUCOSE 123* 109*  BUN 12 13  CREATININE 1.00 0.92  CALCIUM 9.4 8.9   Liver Function Tests Recent Labs    01/12/19 1052  AST 27  ALT 24  ALKPHOS 43  BILITOT 0.9  PROT 7.2  ALBUMIN 3.9   Recent Labs    01/12/19 1052  LIPASE 22   Cardiac Enzymes Recent Labs    01/12/19 2239 01/13/19 0729  TROPONINI <0.03 <0.03   D-Dimer Recent Labs    01/12/19 1052  DDIMER 0.55*   Fasting Lipid Panel   Recent Labs    01/13/19 0729  CHOL 140  HDL 42  LDLCALC 83  TRIG 73  CHOLHDL 3.3   _____________  Ct Angio Chest Pe W And/or Wo Contrast  Result Date:  01/12/2019 CLINICAL DATA:  Severe left-sided chest pain extending into the left arm with acute onset this morning. Chest pressure and shortness of breath. Intermediate probability for pulmonary embolism. EXAM: CT ANGIOGRAPHY CHEST WITH CONTRAST TECHNIQUE: Multidetector CT imaging of the chest was performed using the standard protocol during bolus administration of intravenous contrast. Multiplanar CT image reconstructions and MIPs were obtained to evaluate the vascular anatomy. CONTRAST:  173mL ISOVUE-370 IOPAMIDOL (ISOVUE-370) INJECTION 76% COMPARISON:  Chest radiographs 02/15/2007. FINDINGS: Cardiovascular: The pulmonary arteries are well opacified with contrast to the level of the subsegmental branches. There is no evidence of acute pulmonary embolism. No significant systemic arterial abnormalities identified. The heart size is normal. There is no pericardial effusion. Mediastinum/Nodes: There are no enlarged mediastinal, hilar or axillary lymph nodes. The thyroid gland, trachea and esophagus demonstrate no significant findings. Lungs/Pleura: There is no pleural effusion or pneumothorax. Linear atelectasis or scarring is present in both lower lobes. The lungs are otherwise clear. Upper abdomen: Severe hepatic steatosis. No acute findings identified. Musculoskeletal/Chest wall: There is no chest wall mass or suspicious osseous finding. Mild thoracic spondylosis. Review of the MIP images confirms the above findings. IMPRESSION: 1. No evidence of acute pulmonary embolism or other acute chest process. 2. Mild linear atelectasis or scarring at both lung bases. 3. Severe hepatic steatosis. Electronically Signed   By: Richardean Sale M.D.   On: 01/12/2019 13:13   Nm Myocar Multi W/spect W/wall Motion / Ef  Result Date: 01/13/2019  Blood pressure demonstrated a normal response to exercise.  No T wave inversion was noted during stress.  Upsloping ST segment depression ST segment depression was noted during stress  in the V5, V6, III and aVF leads.  Defect 1: There is a medium defect of severe severity present in the basal inferoseptal, mid inferoseptal, apical septal and apex location.  Findings consistent with prior myocardial infarction vs artifact.  This is an intermediate risk study.  Nuclear stress EF: 59%.  The left ventricular ejection fraction is normal (55-65%).  Fixed perfusion defect in the apical septum and inferoseptum at mid and base. Normal EF. Nonischemic ECG however significant artifact limits ECG interpretation. Below average exercise capacity, no chest pain with stress.   Disposition   Pt is being discharged home today in good condition.  Follow-up Plans & Appointments    Follow-up Information    Fay Records, MD Follow up on 03/17/2019.   Specialty:  Cardiology Why:  Please arrive at 11:15 am for an 11:40 am appt.  Contact information: [address] [place] [address] [phone]          Discharge Instructions    Diet - low sodium heart healthy   Complete  by:  As directed    Increase activity slowly   Complete by:  As directed       Discharge Medications   Allergies as of 01/13/2019      Reactions   Codeine Nausea Only   Penicillins Hives   Has patient had a PCN reaction causing immediate rash, facial/tongue/throat swelling, SOB or lightheadedness with hypotension: Yes Has patient had a PCN reaction causing severe rash involving mucus membranes or skin necrosis: Hives Has patient had a PCN reaction that required hospitalization: No Has patient had a PCN reaction occurring within the last 10 years: No If all of the above answers are "NO", then may proceed with Cephalosporin use.   Talwin [pentazocine] Nausea Only   Tylox [oxycodone-acetaminophen] Nausea Only   Zithromax [azithromycin] Other (See Comments)   Headache      Medication List    TAKE these medications   ADULT GUMMY Chew Chew 2 tablets by mouth daily.   amitriptyline 25  MG tablet Commonly known as:  ELAVIL Take 25 mg by mouth at bedtime.   aspirin EC 81 MG tablet Take 81 mg by mouth every morning.   cyclobenzaprine 10 MG tablet Commonly known as:  FLEXERIL Take 1 tablet (10 mg total) by mouth 2 (two) times daily as needed for muscle spasms.   hydrochlorothiazide 25 MG tablet Commonly known as:  HYDRODIURIL Take 25 mg by mouth daily.   ibuprofen 200 MG tablet Commonly known as:  ADVIL,MOTRIN Take 400 mg by mouth every 6 (six) hours as needed (for pain or headaches).   lisinopril 20 MG tablet Commonly known as:  PRINIVIL,ZESTRIL Take 20 mg by mouth at bedtime.   metoprolol tartrate 25 MG tablet Commonly known as:  LOPRESSOR Take 0.5 tablets (12.5 mg total) by mouth 2 (two) times daily.   nitroGLYCERIN 0.4 MG SL tablet Commonly known as:  NITROSTAT Place 1 tablet (0.4 mg total) under the tongue every 5 (five) minutes x 3 doses as needed for chest pain.   pravastatin 40 MG tablet Commonly known as:  PRAVACHOL Take 40 mg by mouth every evening.         Outstanding Labs/Studies   None  Duration of Discharge Encounter   Greater than 30 minutes including physician time.  Jonetta Speak NP 01/13/2019, 4:38 PM   Patient seen and examined earlier today   I agree with findings as noted above by R barrett, Plan for outpt echo and then follow up as outpt.  Dorris Carnes

## 2019-01-13 NOTE — Progress Notes (Signed)
REviewed myovue   No evidence  for ischemia   ? Artifact, less likely scar in inferoseptal portion  Of heart    LVEF is normal  Recomm:  OK to d/c   Not clearly cardiac CP    Will arrange for f/u echo as outpt and f/u with me in clinic   [facility]

## 2019-01-13 NOTE — Care Management Obs Status (Signed)
[facility] NOTIFICATION   Patient Details  Name: Krystal Gates MRN: [id number] Date of Birth: 22-Mar-1951   Medicare Observation Status Notification Given:  Yes    Bethena Roys, RN 01/13/2019, 3:51 PM

## 2019-01-13 NOTE — Progress Notes (Signed)
Patient taken to nuclear medicine for stress test at 0900hrs.

## 2019-01-13 NOTE — Progress Notes (Addendum)
Progress Note  Patient Name: Krystal Gates Date of Encounter: 01/13/2019  Primary Cardiologist: Krystal Rouge, MD   Subjective   No significant overnight events. Patient denies any chest pain or shortness of breath. Saw patient down in Nuclear Medicine for treadmill myoview. Patient tolerated the procedure well.   Inpatient Medications    Scheduled Meds: . amitriptyline  25 mg Oral QHS  . aspirin EC  81 mg Oral q morning - 10a  . enoxaparin (LOVENOX) injection  40 mg Subcutaneous Q24H  . hydrochlorothiazide  25 mg Oral Daily  . lisinopril  20 mg Oral QHS  . metoprolol tartrate  12.5 mg Oral BID  . multivitamin with minerals  1 tablet Oral Daily  . pravastatin  40 mg Oral QPM  . sodium chloride flush  3 mL Intravenous Q12H   Continuous Infusions: . sodium chloride     PRN Meds: sodium chloride, acetaminophen, cyclobenzaprine, ibuprofen, nitroGLYCERIN, ondansetron (ZOFRAN) IV, sodium chloride flush   Vital Signs    Vitals:   01/13/19 1025 01/13/19 1027 01/13/19 1030 01/13/19 1032  BP: 122/75 124/79 (!) 161/73 139/75  Pulse:      Resp:      Temp:      TempSrc:      SpO2:      Weight:      Height:        Intake/Output Summary (Last 24 hours) at 01/13/2019 1102 Last data filed at 01/12/2019 2100 Gross per 24 hour  Intake 360 ml  Output 3 ml  Net 357 ml   Last 3 Weights 01/13/2019 01/12/2019 01/12/2019  Weight (lbs) 192 lb 4.8 oz 191 lb 14.4 oz 192 lb  Weight (kg) 87.227 kg 87.045 kg 87.091 kg      Telemetry    Unable to review down in Nuclear Medicine. - Personally Reviewed  ECG    Normal sinus rhythm with no acute ischemic changes compared to prior tracings.  - Personally Reviewed  Physical Exam   GEN: No acute distress.   Neck: Supple. Cardiac: RRR. No murmurs, rubs, or gallops.  Respiratory: Clear to auscultation bilaterally. GI: Soft, non-tender, non-distended  MS: No edema lower extremity edema. No deformity. Skin: Warm and dry.  Neuro:  No  focal deficits. Psych: Normal affect   Labs    Chemistry Recent Labs  Lab 01/12/19 1052 01/13/19 0729  NA 142 139  K 3.4* 3.7  CL 107 105  CO2 23 23  GLUCOSE 123* 109*  BUN 12 13  CREATININE 1.00 0.92  CALCIUM 9.4 8.9  PROT 7.2  --   ALBUMIN 3.9  --   AST 27  --   ALT 24  --   ALKPHOS 43  --   BILITOT 0.9  --   GFRNONAA 58* >60  GFRAA >60 >60  ANIONGAP 12 11     Hematology Recent Labs  Lab 01/12/19 1052  WBC 8.4  RBC 4.58  HGB 13.0  HCT 41.0  MCV 89.5  MCH 28.4  MCHC 31.7  RDW 14.0  PLT 292    Cardiac Enzymes Recent Labs  Lab 01/12/19 2239 01/13/19 0729  TROPONINI <0.03 <0.03    Recent Labs  Lab 01/12/19 1122 01/12/19 1400  TROPIPOC 0.00 0.00     BNPNo results for input(s): BNP, PROBNP in the last 168 hours.   DDimer  Recent Labs  Lab 01/12/19 1052  DDIMER 0.55*     Radiology    Ct Angio Chest Pe W And/or Wo Contrast  Result  Date: 01/12/2019 CLINICAL DATA:  Severe left-sided chest pain extending into the left arm with acute onset this morning. Chest pressure and shortness of breath. Intermediate probability for pulmonary embolism. EXAM: CT ANGIOGRAPHY CHEST WITH CONTRAST TECHNIQUE: Multidetector CT imaging of the chest was performed using the standard protocol during bolus administration of intravenous contrast. Multiplanar CT image reconstructions and MIPs were obtained to evaluate the vascular anatomy. CONTRAST:  129mL ISOVUE-370 IOPAMIDOL (ISOVUE-370) INJECTION 76% COMPARISON:  Chest radiographs 02/15/2007. FINDINGS: Cardiovascular: The pulmonary arteries are well opacified with contrast to the level of the subsegmental branches. There is no evidence of acute pulmonary embolism. No significant systemic arterial abnormalities identified. The heart size is normal. There is no pericardial effusion. Mediastinum/Nodes: There are no enlarged mediastinal, hilar or axillary lymph nodes. The thyroid gland, trachea and esophagus demonstrate no  significant findings. Lungs/Pleura: There is no pleural effusion or pneumothorax. Linear atelectasis or scarring is present in both lower lobes. The lungs are otherwise clear. Upper abdomen: Severe hepatic steatosis. No acute findings identified. Musculoskeletal/Chest wall: There is no chest wall mass or suspicious osseous finding. Mild thoracic spondylosis. Review of the MIP images confirms the above findings. IMPRESSION: 1. No evidence of acute pulmonary embolism or other acute chest process. 2. Mild linear atelectasis or scarring at both lung bases. 3. Severe hepatic steatosis. Electronically Signed   By: Krystal Gates M.D.   On: 01/12/2019 13:13    Cardiac Studies   None.  Patient Profile   [name] is a 68 y.o. female with a history of CAD with self-reported 40% blockage years ago on cardiac catheterization, hypertension, and hyperlipidemia who is being seen for chest pain.   Assessment & Plan    Chest Pain - EKG showed no acute ischemic changes. - Troponin negative x3.  - Chest CTA negative for PE and aortic dissection.  - Patient denies any chest pain this morning. - Patient underwent Exercise Myoview this morning and tolerated the exercise portion well. Results pending.  Hypertension - Most recent BP 139/75.  - Continue home HCTZ, Lisinopril, and Lopressor.  Hyperlipidemia - Most recent LDL 94 in 02/2010. - On Pravastatin 40mg  daily at home. - Will recheck lipid panel.   For questions or updates, please contact Krystal Gates Please consult [email or web address] for contact info under        Signed, Krystal Mclean, PA-C  01/13/2019, 11:02 AM    Pt seen and examined   I agree with findingas as noted by Krystal Gates above Pt with CP   Last had severeal years ago   (underwent cath at that time which showed mild dz)    Now has r/o for MI  On exam, pt comfortable Lungs are CTA    Cardiac RRR   No S3  No murmrus Abd is supple Ext are without edema  Myovue is pending  from today    Krystal Gates

## 2019-01-17 ENCOUNTER — Ambulatory Visit: Payer: PRIVATE HEALTH INSURANCE

## 2019-01-29 ENCOUNTER — Encounter (HOSPITAL_COMMUNITY): Payer: Self-pay | Admitting: Physician Assistant

## 2019-02-05 ENCOUNTER — Other Ambulatory Visit: Payer: Self-pay

## 2019-02-05 ENCOUNTER — Ambulatory Visit (HOSPITAL_COMMUNITY): Payer: Medicare Other | Attending: Cardiovascular Disease

## 2019-02-05 DIAGNOSIS — R079 Chest pain, unspecified: Secondary | ICD-10-CM | POA: Insufficient documentation

## 2019-02-10 NOTE — Progress Notes (Signed)
The patient has been notified of the result and verbalized understanding.  All questions (if any) were answered. Jacqulynn Cadet, CMA 02/10/2019 10:00 AM

## 2019-02-26 ENCOUNTER — Inpatient Hospital Stay: Admission: RE | Admit: 2019-02-26 | Payer: PRIVATE HEALTH INSURANCE | Source: Ambulatory Visit

## 2019-03-12 ENCOUNTER — Telehealth: Payer: Self-pay

## 2019-03-12 NOTE — Telephone Encounter (Signed)
LMTCB. Need to change OV with Ross on 4/20 tpo a 930am virtual on 4/20 if pt is agreeable.

## 2019-03-17 ENCOUNTER — Other Ambulatory Visit: Payer: Self-pay

## 2019-03-17 ENCOUNTER — Telehealth: Payer: Self-pay

## 2019-03-17 ENCOUNTER — Telehealth (INDEPENDENT_AMBULATORY_CARE_PROVIDER_SITE_OTHER): Payer: Medicare Other | Admitting: Internal Medicine

## 2019-03-17 ENCOUNTER — Telehealth: Payer: Medicare Other | Admitting: Internal Medicine

## 2019-03-17 ENCOUNTER — Encounter: Payer: Self-pay | Admitting: Internal Medicine

## 2019-03-17 VITALS — Ht 63.0 in | Wt 191.0 lb

## 2019-03-17 DIAGNOSIS — R0789 Other chest pain: Secondary | ICD-10-CM | POA: Diagnosis not present

## 2019-03-17 DIAGNOSIS — E782 Mixed hyperlipidemia: Secondary | ICD-10-CM

## 2019-03-17 DIAGNOSIS — I10 Essential (primary) hypertension: Secondary | ICD-10-CM

## 2019-03-17 DIAGNOSIS — R079 Chest pain, unspecified: Secondary | ICD-10-CM

## 2019-03-17 NOTE — Patient Instructions (Signed)
Medication Instructions:  No changes If you need a refill on your cardiac medications before your next appointment, please call your pharmacy.   Lab work: none   Testing/Procedures: none  Follow-Up: At [facility], you and your health needs are our priority.  As part of our continuing mission to provide you with exceptional heart care, we have created designated Provider Care Teams.  These Care Teams include your primary Cardiologist (physician) and Advanced Practice Providers (APPs -  Physician Assistants and Nurse Practitioners) who all work together to provide you with the care you need, when you need it. You will need a follow up appointment in:  12 months.  Please call our office 2 months in advance to schedule this appointment.  You may see DR. ROSS or one of the following Advanced Practice Providers on your designated Care Team: Richardson Dopp, PA-C [place], [place] . Daune Perch, NP  Any Other Special Instructions Will Be Listed Below (If Applicable).

## 2019-03-17 NOTE — Telephone Encounter (Signed)
Spoke with pt who is in agreement to VIRTUAL PHONE visit with Dr. Harrington Challenger. She was asleep and has a bad headache right now.  Moved her appointment to this afternoon 3:00pm  Pt needs consent. Aware we will obtain when we call her back today.  Does not have way to check BP/HR.

## 2019-03-17 NOTE — Progress Notes (Signed)
Virtual Visit via Telephone Note   This visit type was conducted due to national recommendations for restrictions regarding the COVID-19 Pandemic (e.g. social distancing) in an effort to limit this patient's exposure and mitigate transmission in our community.  Due to her co-morbid illnesses, this patient is at least at moderate risk for complications without adequate follow up.  This format is felt to be most appropriate for this patient at this time.  The patient did not have access to video technology/had technical difficulties with video requiring transitioning to audio format only (telephone).  All issues noted in this document were discussed and addressed.  No physical exam could be performed with this format.  Please refer to the patient's chart for her  consent to telehealth for [facility].   Evaluation Performed:  Follow-up visit  Date:  03/17/2019   ID:  Krystal Gates, DOB 04/04/1951, MRN [id number]  Patient Location: Home Provider Location: Home  PCP:  Nolene Ebbs, MD  Cardiologist:  Harrington Challenger Electrophysiologist:  None   Chief Complaint:  F/U of CP    History of Present Illness:    Krystal Gates is a 68 y.o. female with hx of HTN and HL   Admitted to [facility] in Feb with CP   R/O for MI   Underwent stress myovue   No ischemia noted     Echo after discharged confirmed normal LVEF   No signif valvular dz     Since then she has done well  She deneis CP   Breathing is OK   No edema   No palpitaitons     The patient does KDX:IPJA symptoms concerning for COVID-19 infection (fever, chills, cough, or new shortness of breath).    Past Medical History:  Diagnosis Date  . Arthritis   . Heart disease    "40% heart blockage, dx 20 years ago"per pt. NO treatment.  . Hyperlipidemia   . Hypertension   . Sciatica    Past Surgical History:  Procedure Laterality Date  . ABDOMINAL HYSTERECTOMY    . ANAL FISSURE REPAIR    . BILATERAL CARPAL TUNNEL RELEASE  Bilateral   . CARDIAC CATHETERIZATION  2004   Dr Tamala Julian, 30-50% LAD, med rx  . ECTOPIC PREGNANCY SURGERY       Current Meds  Medication Sig  . amitriptyline (ELAVIL) 25 MG tablet Take 25 mg by mouth at bedtime.   Marland Kitchen aspirin EC 81 MG tablet Take 81 mg by mouth every morning.  . cyclobenzaprine (FLEXERIL) 10 MG tablet Take 1 tablet (10 mg total) by mouth 2 (two) times daily as needed for muscle spasms.  . hydrochlorothiazide (HYDRODIURIL) 25 MG tablet Take 25 mg by mouth daily.  Marland Kitchen ibuprofen (ADVIL,MOTRIN) 200 MG tablet Take 400 mg by mouth every 6 (six) hours as needed (for pain or headaches).  Marland Kitchen lisinopril (PRINIVIL,ZESTRIL) 20 MG tablet Take 20 mg by mouth at bedtime.   . metoprolol tartrate (LOPRESSOR) 25 MG tablet Take 0.5 tablets (12.5 mg total) by mouth 2 (two) times daily.  . nitroGLYCERIN (NITROSTAT) 0.4 MG SL tablet Place 1 tablet (0.4 mg total) under the tongue every 5 (five) minutes x 3 doses as needed for chest pain.  . pravastatin (PRAVACHOL) 40 MG tablet Take 40 mg by mouth every evening.     Allergies:   Codeine; Penicillins; Talwin [pentazocine]; Tylox [oxycodone-acetaminophen]; and Zithromax [azithromycin]   Social History   Tobacco Use  . Smoking status: Never Smoker  . Smokeless tobacco: Never Used  Substance Use Topics  . Alcohol use: No  . Drug use: No     Family Hx: The patient's family history is not on file. She was adopted.  ROS:   Please see the history of present illness.    All other systems reviewed and are negative.   Prior CV studies:   The following studies were reviewed today:  Labs/Other Tests and Data Reviewed:    EKG:   Recent Labs: 01/12/2019: ALT 24; Hemoglobin 13.0; Platelets 292 01/13/2019: BUN 13; Creatinine, Ser 0.92; Potassium 3.7; Sodium 139   Recent Lipid Panel Lab Results  Component Value Date/Time   CHOL 140 01/13/2019 07:29 AM   TRIG 73 01/13/2019 07:29 AM   HDL 42 01/13/2019 07:29 AM   CHOLHDL 3.3 01/13/2019 07:29 AM    LDLCALC 83 01/13/2019 07:29 AM    Wt Readings from Last 3 Encounters:  03/17/19 191 lb (86.6 kg)  01/13/19 192 lb 4.8 oz (87.2 kg)  04/22/17 192 lb (87.1 kg)     Objective:    Vital Signs:  Ht '5\' 3"'$  (1.6 m)   Wt 191 lb (86.6 kg)   BMI 33.83 kg/m   Exam not done as tele visit  ASSESSMENT & PLAN:    1. Chest pain   Resolved  No recurrence   Reviewed myovue and echo with her   I was not convinced CP was cardiac in origin  2   HTN  Continue BP meds  3   HL   On statin   lst lipid in hosp LDL 83   Keep on this regimen  Will f/u in 1 year   Sooner if problems develop  4  COVID-19 Education: The signs and symptoms of COVID-19 were discussed with the patient and how to seek care for testing (follow up with PCP or arrange E-visit). The importance of social distancing was discussed today.  Time:   Today, I have spent 30  minutes with the patient with telehealth technology discussing the above problems.     Medication Adjustments/Labs and Tests Ordered: Current medicines are reviewed at length with the patient today.  Concerns regarding medicines are outlined above.   Tests Ordered: No orders of the defined types were placed in this encounter.   Medication Changes: No orders of the defined types were placed in this encounter.   Disposition:  Follow up 1 year   Signed, Dorris Carnes, MD  03/17/2019 3:09 PM    [place]

## 2019-03-17 NOTE — Telephone Encounter (Signed)
Called patient and got verbal consent to telephone visit today 03/17/2019 at 3pm with Dr Harrington Challenger.      TELEPHONE CALL NOTE  This patient has been deemed a candidate for follow-up tele-health visit to limit community exposure during the Covid-19 pandemic. I spoke with the patient via phone to discuss instructions. This has been outlined on the patient's AVS (dotphrase: hcevisitinfo). The patient was advised to review the section on consent for treatment as well. The patient will receive a phone call 2-3 days prior to their E-Visit at which time consent will be verbally confirmed. A Virtual Office Visit appointment type has been scheduled for 03/17/2019 at 3:00pm with Dr Harrington Challenger, with "VIDEO" or "TELEPHONE" in the appointment notes - patient prefers TELEPHONE type.  I have either confirmed the patient is active in MyChart or offered to send sign-up link to phone/email via Mychart icon beside patient's photo. Not Active  Krystal Gates, [facility] 03/17/2019 2:46 PM     Virtual Visit Pre-Appointment Phone Call  Steps For Call:  1. Confirm consent - "In the setting of the current Covid19 crisis, you are scheduled for a (phone or video) visit with your provider on (date) at (time).  Just as we do with many in-office visits, in order for you to participate in this visit, we must obtain consent.  If you'd like, I can send this to your mychart (if signed up) or email for you to review.  Otherwise, I can obtain your verbal consent now.  All virtual visits are billed to your insurance company just like a normal visit would be.  By agreeing to a virtual visit, we'd like you to understand that the technology does not allow for your provider to perform an examination, and thus may limit your provider's ability to fully assess your condition. If your provider identifies any concerns that need to be evaluated in person, we will make arrangements to do so.  Finally, though the technology is pretty good, we cannot assure that it  will always work on either your or our end, and in the setting of a video visit, we may have to convert it to a phone-only visit.  In either situation, we cannot ensure that we have a secure connection.  Are you willing to proceed?" STAFF: Did the patient verbally acknowledge consent to telehealth visit? Document YES/NO here: YES  2. Confirm the BEST phone number to call the day of the visit by including in appointment notes  3. Give patient instructions for WebEx/MyChart download to smartphone as below or Doximity/Doxy.me if video visit (depending on what platform provider is using)  4. Advise patient to be prepared with their blood pressure, heart rate, weight, any heart rhythm information, their current medicines, and a piece of paper and pen handy for any instructions they may receive the day of their visit  5. Inform patient they will receive a phone call 15 minutes prior to their appointment time (may be from unknown caller ID) so they should be prepared to answer  6. Confirm that appointment type is correct in Epic appointment notes (VIDEO vs PHONE)     TELEPHONE CALL NOTE  Krystal Gates has been deemed a candidate for a follow-up tele-health visit to limit community exposure during the Covid-19 pandemic. I spoke with the patient via phone to ensure availability of phone/video source, confirm preferred email & phone number, and discuss instructions and expectations.  I reminded Krystal Gates to be prepared with any vital sign and/or heart rhythm information that  could potentially be obtained via home monitoring, at the time of her visit. I reminded Krystal Gates to expect a phone call at the time of her visit if her visit.  Gurpreet Mikhail, CMA 03/17/2019 2:47 PM   IF USING DOXIMITY or DOXY.ME - The patient will receive a link just prior to their visit, either by text or email (to be determined day of appointment depending on if it's doxy.me or Doximity).     FULL  LENGTH CONSENT FOR TELE-HEALTH VISIT   I hereby voluntarily request, consent and authorize [facility] and its employed or contracted physicians, physician assistants, nurse practitioners or other licensed health care professionals (the Practitioner), to provide me with telemedicine health care services (the Services") as deemed necessary by the treating Practitioner. I acknowledge and consent to receive the Services by the Practitioner via telemedicine. I understand that the telemedicine visit will involve communicating with the Practitioner through live audiovisual communication technology and the disclosure of certain medical information by electronic transmission. I acknowledge that I have been given the opportunity to request an in-person assessment or other available alternative prior to the telemedicine visit and am voluntarily participating in the telemedicine visit.  I understand that I have the right to withhold or withdraw my consent to the use of telemedicine in the course of my care at any time, without affecting my right to future care or treatment, and that the Practitioner or I may terminate the telemedicine visit at any time. I understand that I have the right to inspect all information obtained and/or recorded in the course of the telemedicine visit and may receive copies of available information for a reasonable fee.  I understand that some of the potential risks of receiving the Services via telemedicine include:   Delay or interruption in medical evaluation due to technological equipment failure or disruption;  Information transmitted may not be sufficient (e.g. poor resolution of images) to allow for appropriate medical decision making by the Practitioner; and/or   In rare instances, security protocols could fail, causing a breach of personal health information.  Furthermore, I acknowledge that it is my responsibility to provide information about my medical history, conditions  and care that is complete and accurate to the best of my ability. I acknowledge that Practitioner's advice, recommendations, and/or decision may be based on factors not within their control, such as incomplete or inaccurate data provided by me or distortions of diagnostic images or specimens that may result from electronic transmissions. I understand that the practice of medicine is not an exact science and that Practitioner makes no warranties or guarantees regarding treatment outcomes. I acknowledge that I will receive a copy of this consent concurrently upon execution via email to the email address I last provided but may also request a printed copy by calling the office of [facility].    I understand that my insurance will be billed for this visit.   I have read or had this consent read to me.  I understand the contents of this consent, which adequately explains the benefits and risks of the Services being provided via telemedicine.   I have been provided ample opportunity to ask questions regarding this consent and the Services and have had my questions answered to my satisfaction.  I give my informed consent for the services to be provided through the use of telemedicine in my medical care  By participating in this telemedicine visit I agree to the above.

## 2019-03-27 ENCOUNTER — Ambulatory Visit: Payer: PRIVATE HEALTH INSURANCE

## 2019-05-09 ENCOUNTER — Other Ambulatory Visit: Payer: Self-pay

## 2019-05-09 ENCOUNTER — Ambulatory Visit
Admission: RE | Admit: 2019-05-09 | Discharge: 2019-05-09 | Disposition: A | Discharge status: Home / Self Care | Payer: Medicare Other | Source: Ambulatory Visit | Attending: Internal Medicine | Admitting: Internal Medicine

## 2019-05-09 DIAGNOSIS — Z1231 Encounter for screening mammogram for malignant neoplasm of breast: Secondary | ICD-10-CM

## 2020-01-25 ENCOUNTER — Ambulatory Visit: Payer: Medicare Other | Attending: Internal Medicine

## 2020-01-25 DIAGNOSIS — Z23 Encounter for immunization: Secondary | ICD-10-CM

## 2020-01-25 NOTE — Progress Notes (Signed)
   Covid-19 Vaccination Clinic  Name:  Krystal Gates    MRN: [id number] DOB: 08/01/1951  01/25/2020  [name] was observed post Covid-19 immunization for 15 minutes without incidence. She was provided with Vaccine Information Sheet and instruction to access the V-Safe system.   [name] was instructed to call 911 with any severe reactions post vaccine: Marland Kitchen Difficulty breathing  . Swelling of your face and throat  . A fast heartbeat  . A bad rash all over your body  . Dizziness and weakness    Immunizations Administered    Name Date Dose VIS Date Route   Pfizer COVID-19 Vaccine 01/25/2020  2:33 PM 0.3 mL 11/07/2019 Intramuscular   Manufacturer: [place]   Lot: [id number]   [place]: [id number]

## 2020-02-24 ENCOUNTER — Ambulatory Visit: Payer: Medicare Other | Attending: Internal Medicine

## 2020-02-24 DIAGNOSIS — Z23 Encounter for immunization: Secondary | ICD-10-CM

## 2020-02-24 NOTE — Progress Notes (Signed)
   Covid-19 Vaccination Clinic  Name:  Krystal Gates    MRN: [id number] DOB: [birth year]  02/24/2020  [name] was observed post Covid-19 immunization for 15 minutes without incident. She was provided with Vaccine Information Sheet and instruction to access the V-Safe system.   [name] was instructed to call 911 with any severe reactions post vaccine: Marland Kitchen Difficulty breathing  . Swelling of face and throat  . A fast heartbeat  . A bad rash all over body  . Dizziness and weakness   Immunizations Administered    Name Date Dose VIS Date Route   Pfizer COVID-19 Vaccine 02/24/2020 10:12 AM 0.3 mL 11/07/2019 Intramuscular   Manufacturer: [employer]   Lot: [id number]   [place]: [id number]

## 2020-12-11 ENCOUNTER — Other Ambulatory Visit: Payer: Self-pay | Admitting: Internal Medicine

## 2020-12-11 DIAGNOSIS — E2839 Other primary ovarian failure: Secondary | ICD-10-CM

## 2020-12-23 ENCOUNTER — Other Ambulatory Visit: Payer: Self-pay | Admitting: Internal Medicine

## 2020-12-23 DIAGNOSIS — Z1231 Encounter for screening mammogram for malignant neoplasm of breast: Secondary | ICD-10-CM

## 2021-05-09 ENCOUNTER — Ambulatory Visit
Admission: RE | Admit: 2021-05-09 | Discharge: 2021-05-09 | Disposition: A | Discharge status: Home / Self Care | Payer: Medicare (Managed Care) | Source: Ambulatory Visit | Attending: Internal Medicine | Admitting: Internal Medicine

## 2021-05-09 ENCOUNTER — Other Ambulatory Visit: Payer: Self-pay

## 2021-05-09 DIAGNOSIS — Z1231 Encounter for screening mammogram for malignant neoplasm of breast: Secondary | ICD-10-CM

## 2021-05-09 DIAGNOSIS — E2839 Other primary ovarian failure: Secondary | ICD-10-CM

## 2022-02-04 ENCOUNTER — Ambulatory Visit
Admission: EM | Admit: 2022-02-04 | Discharge: 2022-02-04 | Disposition: A | Discharge status: Home / Self Care | Payer: Medicare Other | Attending: Internal Medicine | Admitting: Internal Medicine

## 2022-02-04 ENCOUNTER — Other Ambulatory Visit: Payer: Self-pay

## 2022-02-04 ENCOUNTER — Encounter: Payer: Self-pay | Admitting: Emergency Medicine

## 2022-02-04 DIAGNOSIS — J029 Acute pharyngitis, unspecified: Secondary | ICD-10-CM | POA: Diagnosis not present

## 2022-02-04 DIAGNOSIS — R051 Acute cough: Secondary | ICD-10-CM | POA: Insufficient documentation

## 2022-02-04 DIAGNOSIS — B349 Viral infection, unspecified: Secondary | ICD-10-CM | POA: Insufficient documentation

## 2022-02-04 LAB — POCT RAPID STREP A (OFFICE): Rapid Strep A Screen: NEGATIVE

## 2022-02-04 MED ORDER — CHLORASEPTIC 1.4 % MT LIQD: 1.0000 | OROMUCOSAL | 0 refills | Status: DC | PRN

## 2022-02-04 MED ORDER — FLUTICASONE PROPIONATE 50 MCG/ACT NA SUSP: 1.0000 | Freq: Every day | NASAL | 0 refills | Status: AC

## 2022-02-04 MED ORDER — BENZONATATE 100 MG PO CAPS: 100.0000 mg | ORAL_CAPSULE | Freq: Three times a day (TID) | ORAL | 0 refills | Status: DC | PRN

## 2022-02-04 NOTE — ED Provider Notes (Signed)
[facility]    CSN: [id number] Arrival date & time: 02/04/22  0844      History   Chief Complaint Chief Complaint  Patient presents with   Sore Throat    HPI Krystal Gates is a 71 y.o. female.   Patient presents with sore throat, cough, fever that has been present for approximately 2 days.  Patient is not sure of Tmax at home.  Denies any known sick contacts.  Patient has taken Coricidin HBP and NyQuil with minimal improvement in symptoms.  Denies chest pain, shortness of breath, nasal congestion, runny nose, nausea, vomiting, diarrhea, abdominal pain.   Sore Throat   Past Medical History:  Diagnosis Date   Arthritis    Heart disease    "40% heart blockage, dx 20 years ago"per pt. NO treatment.   Hyperlipidemia    Hypertension    Sciatica     Patient Active Problem List   Diagnosis Date Noted   Chest pain syndrome 01/12/2019   Ingrown toenail 02/04/2018   HYPERTENSION, BENIGN SYSTEMIC 01/24/2007   CORONARY, ARTERIOSCLEROSIS 01/24/2007   BRADYCARDIA 01/24/2007   INSOMNIA NOS 01/24/2007    Past Surgical History:  Procedure Laterality Date   ABDOMINAL HYSTERECTOMY     ANAL FISSURE REPAIR     BILATERAL CARPAL TUNNEL RELEASE Bilateral    CARDIAC CATHETERIZATION  2004   Dr Tamala Julian, 30-50% LAD, med rx   ECTOPIC PREGNANCY SURGERY      OB History   No obstetric history on file.      Home Medications    Prior to Admission medications   Medication Sig Start Date End Date Taking? Authorizing Provider  amitriptyline (ELAVIL) 25 MG tablet Take 25 mg by mouth at bedtime.  01/10/18  Yes [provider]  aspirin EC 81 MG tablet Take 81 mg by mouth every morning.   Yes [provider]  benzonatate (TESSALON) 100 MG capsule Take 1 capsule (100 mg total) by mouth every 8 (eight) hours as needed for cough. 02/04/22  Yes Kseniya Grunden, Michele Rockers, FNP  cyclobenzaprine (FLEXERIL) 10 MG tablet Take 1 tablet (10 mg total) by mouth 2 (two) times daily  as needed for muscle spasms. 04/15/13  Yes Domenic Moras, PA-C  fluticasone (FLONASE) 50 MCG/ACT nasal spray Place 1 spray into both nostrils daily for 3 days. 02/04/22 02/07/22 Yes Olson Lucarelli, Michele Rockers, FNP  hydrochlorothiazide (HYDRODIURIL) 25 MG tablet Take 25 mg by mouth daily.   Yes [provider]  ibuprofen (ADVIL,MOTRIN) 200 MG tablet Take 400 mg by mouth every 6 (six) hours as needed (for pain or headaches).   Yes [provider]  lisinopril (PRINIVIL,ZESTRIL) 20 MG tablet Take 20 mg by mouth at bedtime.    Yes [provider]  phenol (CHLORASEPTIC) 1.4 % LIQD Use as directed 1 spray in the mouth or throat as needed for throat irritation / pain. 02/04/22  Yes Idamae Coccia, Hildred Alamin E, FNP  pravastatin (PRAVACHOL) 40 MG tablet Take 40 mg by mouth every evening.   Yes [provider]  metoprolol tartrate (LOPRESSOR) 25 MG tablet Take 0.5 tablets (12.5 mg total) by mouth 2 (two) times daily. 01/13/19   Barrett, Evelene Croon, PA-C  nitroGLYCERIN (NITROSTAT) 0.4 MG SL tablet Place 1 tablet (0.4 mg total) under the tongue every 5 (five) minutes x 3 doses as needed for chest pain. 01/13/19   Barrett, Evelene Croon, PA-C    Family History Family History  Adopted: Yes    Social History Social History  Tobacco Use   Smoking status: Never   Smokeless tobacco: Never  Substance Use Topics   Alcohol use: No   Drug use: No     Allergies   Codeine, Penicillins, Talwin [pentazocine], Tylox [oxycodone-acetaminophen], and Zithromax [azithromycin]   Review of Systems Review of Systems Per HPI  Physical Exam Triage Vital Signs ED Triage Vitals  Enc Vitals Group     BP 02/04/22 0859 (!) 144/81     Pulse Rate 02/04/22 0859 (!) 110     Resp 02/04/22 0859 18     Temp 02/04/22 0859 (!) 100.7 F (38.2 C)     Temp Source 02/04/22 0859 Oral     SpO2 02/04/22 0859 100 %     Weight 02/04/22 0901 200 lb (90.7 kg)     Height 02/04/22 0901 '5\' 3"'$  (1.6 m)     Head Circumference --       Peak Flow --      Pain Score 02/04/22 0901 10     Pain Loc --      Pain Edu? --      Excl. in [place]? --    No data found.  Updated Vital Signs BP (!) 144/81 (BP Location: Left Arm)    Pulse (!) 110    Temp (!) 100.7 F (38.2 C) (Oral)    Resp 18    Ht '5\' 3"'$  (1.6 m)    Wt 200 lb (90.7 kg)    SpO2 100%    BMI 35.43 kg/m   Visual Acuity Right Eye Distance:   Left Eye Distance:   Bilateral Distance:    Right Eye Near:   Left Eye Near:    Bilateral Near:     Physical Exam Constitutional:      General: She is not in acute distress.    Appearance: Normal appearance. She is not toxic-appearing or diaphoretic.  HENT:     Head: Normocephalic and atraumatic.     Right Ear: Tympanic membrane and ear canal normal.     Left Ear: Tympanic membrane and ear canal normal.     Nose: Congestion present.     Mouth/Throat:     Mouth: Mucous membranes are moist.     Pharynx: Posterior oropharyngeal erythema present.  Eyes:     Extraocular Movements: Extraocular movements intact.     Conjunctiva/sclera: Conjunctivae normal.     Pupils: Pupils are equal, round, and reactive to light.  Cardiovascular:     Rate and Rhythm: Normal rate and regular rhythm.     Pulses: Normal pulses.     Heart sounds: Normal heart sounds.  Pulmonary:     Effort: Pulmonary effort is normal. No respiratory distress.     Breath sounds: Normal breath sounds. No wheezing.  Abdominal:     General: Abdomen is flat. Bowel sounds are normal.     Palpations: Abdomen is soft.  Musculoskeletal:        General: Normal range of motion.     Cervical back: Normal range of motion.  Skin:    General: Skin is warm and dry.  Neurological:     General: No focal deficit present.     Mental Status: She is alert and oriented to person, place, and time. Mental status is at baseline.  Psychiatric:        Mood and Affect: Mood normal.        Behavior: Behavior normal.     UC Treatments / Results  Labs (all labs ordered are  listed, but only abnormal results are displayed) Labs Reviewed  COVID-19, FLU A+B NAA  CULTURE, GROUP A STREP [facility])  POCT RAPID STREP A (OFFICE)    EKG   Radiology No results found.  Procedures Procedures (including critical care time)  Medications Ordered in UC Medications - No data to display  Initial Impression / Assessment and Plan / UC Course  I have reviewed the triage vital signs and the nursing notes.  Pertinent labs & imaging results that were available during my care of the patient were reviewed by me and considered in my medical decision making (see chart for details).     Patient presents with symptoms likely from a viral upper respiratory infection. Differential includes bacterial pneumonia, sinusitis, allergic rhinitis, COVID-19, flu. Do not suspect underlying cardiopulmonary process. Symptoms seem unlikely related to ACS, CHF or COPD exacerbations, pneumonia, pneumothorax. Patient is nontoxic appearing and not in need of emergent medical intervention.  Rapid strep was negative.  Throat culture, COVID-19, flu test pending. No signs of peritonsillar abscess on exam.   Recommended symptom control with over the counter medications.  Patient sent prescriptions.  Return if symptoms fail to improve in 1-2 weeks or you develop shortness of breath, chest pain, severe headache. Patient states understanding and is agreeable.  Discharged with PCP followup.  Final Clinical Impressions(s) / UC Diagnoses   Final diagnoses:  Viral illness  Sore throat  Acute cough     Discharge Instructions      Your strep test was negative.  Throat culture, COVID-19, flu test is pending.  We will call if it is positive.  You have been prescribed 3 medications to help alleviate symptoms.  Please follow-up if symptoms persist or worsen.    ED Prescriptions     Medication Sig Dispense Auth. Provider   benzonatate (TESSALON) 100 MG capsule Take 1 capsule (100 mg total) by mouth every  8 (eight) hours as needed for cough. 21 capsule [place], Sandy E, [place]   fluticasone [facility]) 50 MCG/ACT nasal spray Place 1 spray into both nostrils daily for 3 days. 16 g Emerald Gehres, Hildred Alamin E, [place]   phenol (CHLORASEPTIC) 1.4 % LIQD Use as directed 1 spray in the mouth or throat as needed for throat irritation / pain. 118 mL Teodora Medici, [place]      PDMP not reviewed this encounter.   Teodora Medici, [place] 02/04/22 [phone]

## 2022-02-04 NOTE — ED Triage Notes (Signed)
Patient c/o sore throat x 2 days, productive cough, when she swallows it hurts in her ears.  Patient has taken Chloricidin and Nyquil. ?

## 2022-02-04 NOTE — Discharge Instructions (Signed)
Your strep test was negative.  Throat culture, COVID-19, flu test is pending.  We will call if it is positive.  You have been prescribed 3 medications to help alleviate symptoms.  Please follow-up if symptoms persist or worsen. ?

## 2022-02-05 LAB — COVID-19, FLU A+B NAA
Influenza A, NAA: NOT DETECTED
Influenza B, NAA: NOT DETECTED
SARS-CoV-2, NAA: DETECTED — AB

## 2022-02-06 LAB — CULTURE, GROUP A STREP (THRC)

## 2022-04-17 ENCOUNTER — Other Ambulatory Visit: Payer: Self-pay | Admitting: Family Medicine

## 2022-04-17 DIAGNOSIS — Z1231 Encounter for screening mammogram for malignant neoplasm of breast: Secondary | ICD-10-CM

## 2022-05-18 ENCOUNTER — Ambulatory Visit
Admission: RE | Admit: 2022-05-18 | Discharge: 2022-05-18 | Disposition: A | Discharge status: Home / Self Care | Payer: Medicare Other | Source: Ambulatory Visit | Attending: Family Medicine | Admitting: Family Medicine

## 2022-05-18 DIAGNOSIS — Z1231 Encounter for screening mammogram for malignant neoplasm of breast: Secondary | ICD-10-CM

## 2022-09-26 LAB — COLOGUARD: COLOGUARD: NEGATIVE

## 2023-06-28 ENCOUNTER — Other Ambulatory Visit: Payer: Self-pay | Admitting: Adult Health

## 2023-06-28 DIAGNOSIS — Z1231 Encounter for screening mammogram for malignant neoplasm of breast: Secondary | ICD-10-CM

## 2023-07-18 ENCOUNTER — Ambulatory Visit
Admission: RE | Admit: 2023-07-18 | Discharge: 2023-07-18 | Disposition: A | Discharge status: Home / Self Care | Payer: Medicare HMO | Source: Ambulatory Visit | Attending: Adult Health | Admitting: Adult Health

## 2023-07-18 DIAGNOSIS — Z1231 Encounter for screening mammogram for malignant neoplasm of breast: Secondary | ICD-10-CM

## 2023-09-01 ENCOUNTER — Other Ambulatory Visit: Payer: Self-pay

## 2023-09-01 ENCOUNTER — Encounter (HOSPITAL_COMMUNITY): Payer: Self-pay

## 2023-09-01 ENCOUNTER — Emergency Department (HOSPITAL_COMMUNITY)
Admission: EM | Admit: 2023-09-01 | Discharge: 2023-09-01 | Disposition: A | Discharge status: Home / Self Care | Payer: Medicare HMO | Attending: Emergency Medicine | Admitting: Emergency Medicine

## 2023-09-01 DIAGNOSIS — Z7982 Long term (current) use of aspirin: Secondary | ICD-10-CM | POA: Diagnosis not present

## 2023-09-01 DIAGNOSIS — Z79899 Other long term (current) drug therapy: Secondary | ICD-10-CM | POA: Insufficient documentation

## 2023-09-01 DIAGNOSIS — I10 Essential (primary) hypertension: Secondary | ICD-10-CM | POA: Diagnosis not present

## 2023-09-01 DIAGNOSIS — R55 Syncope and collapse: Secondary | ICD-10-CM | POA: Diagnosis present

## 2023-09-01 DIAGNOSIS — E876 Hypokalemia: Secondary | ICD-10-CM | POA: Diagnosis not present

## 2023-09-01 LAB — CBG MONITORING, ED: Glucose-Capillary: 95 mg/dL (ref 70–99)

## 2023-09-01 LAB — BASIC METABOLIC PANEL
Anion gap: 7 (ref 5–15)
BUN: 9 mg/dL (ref 8–23)
CO2: 26 mmol/L (ref 22–32)
Calcium: 9.1 mg/dL (ref 8.9–10.3)
Chloride: 104 mmol/L (ref 98–111)
Creatinine, Ser: 0.95 mg/dL (ref 0.44–1.00)
GFR, Estimated: >60 mL/min (ref >60)
Glucose, Bld: 97 mg/dL (ref 70–99)
Potassium: 3.3 mmol/L — ABNORMAL LOW (ref 3.5–5.1)
Sodium: 137 mmol/L (ref 135–145)

## 2023-09-01 LAB — URINALYSIS, ROUTINE W REFLEX MICROSCOPIC
Bacteria, UA: NONE SEEN
Bilirubin Urine: NEGATIVE
Glucose, UA: NEGATIVE mg/dL
Hgb urine dipstick: NEGATIVE
Ketones, ur: NEGATIVE mg/dL
Nitrite: NEGATIVE
Protein, ur: NEGATIVE mg/dL
Specific Gravity, Urine: 1.015 (ref 1.005–1.030)
pH: 7 (ref 5.0–8.0)

## 2023-09-01 LAB — CBC
HCT: 42.6 % (ref 36.0–46.0)
Hemoglobin: 13.7 g/dL (ref 12.0–15.0)
MCH: 28.2 pg (ref 26.0–34.0)
MCHC: 32.2 g/dL (ref 30.0–36.0)
MCV: 87.8 fL (ref 80.0–100.0)
Platelets: 253 10*3/uL (ref 150–400)
RBC: 4.85 MIL/uL (ref 3.87–5.11)
RDW: 14 % (ref 11.5–15.5)
WBC: 5.9 10*3/uL (ref 4.0–10.5)
nRBC: 0 % (ref 0.0–0.2)

## 2023-09-01 MED ORDER — POTASSIUM CHLORIDE CRYS ER 20 MEQ PO TBCR
40.0000 meq | EXTENDED_RELEASE_TABLET | Freq: Once | ORAL | Status: AC
  Administered 2023-09-01: 40 meq via ORAL
  Filled 2023-09-01: qty 2

## 2023-09-01 NOTE — ED Triage Notes (Addendum)
Pt was at church, just finished singing, was walking when she had a syncopal episode and fell down 2-3 carpeted steps. Pt denies any pain. VSS. Pt a.o, denies any blood thinners. Pt c.o generalized fatigue for the past 3 days after getting a COVID booster vaccine.

## 2023-09-01 NOTE — Discharge Instructions (Signed)
You were seen for passing out in the emergency department.   At home, please stay well-hydrated.    Check your MyChart online for the results of any tests that had not resulted by the time you left the emergency department.   Follow-up with your primary doctor in 2-3 days regarding your visit.    Return immediately to the emergency department if you experience any of the following: Recurrent fainting, severe headache, or any other concerning symptoms.    Thank you for visiting our Emergency Department. It was a pleasure taking care of you today.

## 2023-09-01 NOTE — ED Provider Notes (Signed)
[place] EMERGENCY DEPARTMENT AT [facility] Provider Note   CSN: [id number] Arrival date & time: 09/01/23  1608     History {Add pertinent medical, surgical, social history, OB history to HPI:1} Chief Complaint  Patient presents with   Loss of Consciousness    Krystal Gates is a 72 y.o. female.  71 year old female with a history of hypertension and hyperlipidemia who presents emergency department after syncopal episode.  Patient reports that she was singing in choir at church and was going down the stairs when she lost consciousness.  Thinks that she did not blackout all the way.  Says that she slowly went down and onto her right side.  She could still hear voices when this happened.  No head strike.  No headache afterwards or neck pain.  Is on aspirin but no blood thinners.  No preceding symptoms.  No chest pain or shortness of breath afterwards.  Got a COVID shot several days ago and says that she has had generalized fatigue and weakness because of that.       Home Medications Prior to Admission medications   Medication Sig Start Date End Date Taking? Authorizing Provider  amitriptyline (ELAVIL) 25 MG tablet Take 25 mg by mouth at bedtime.  01/10/18   [provider]  aspirin EC 81 MG tablet Take 81 mg by mouth every morning.    [provider]  benzonatate (TESSALON) 100 MG capsule Take 1 capsule (100 mg total) by mouth every 8 (eight) hours as needed for cough. 02/04/22   Gustavus Bryant, FNP  cyclobenzaprine (FLEXERIL) 10 MG tablet Take 1 tablet (10 mg total) by mouth 2 (two) times daily as needed for muscle spasms. 04/15/13   Fayrene Helper, PA-C  fluticasone (FLONASE) 50 MCG/ACT nasal spray Place 1 spray into both nostrils daily for 3 days. 02/04/22 02/07/22  Gustavus Bryant, FNP  hydrochlorothiazide (HYDRODIURIL) 25 MG tablet Take 25 mg by mouth daily.    [provider]  ibuprofen (ADVIL,MOTRIN) 200 MG tablet Take 400 mg by mouth every 6  (six) hours as needed (for pain or headaches).    [provider]  lisinopril (PRINIVIL,ZESTRIL) 20 MG tablet Take 20 mg by mouth at bedtime.     [provider]  metoprolol tartrate (LOPRESSOR) 25 MG tablet Take 0.5 tablets (12.5 mg total) by mouth 2 (two) times daily. 01/13/19   Barrett, Joline Salt, PA-C  nitroGLYCERIN (NITROSTAT) 0.4 MG SL tablet Place 1 tablet (0.4 mg total) under the tongue every 5 (five) minutes x 3 doses as needed for chest pain. 01/13/19   Barrett, Joline Salt, PA-C  phenol (CHLORASEPTIC) 1.4 % LIQD Use as directed 1 spray in the mouth or throat as needed for throat irritation / pain. 02/04/22   Gustavus Bryant, FNP  pravastatin (PRAVACHOL) 40 MG tablet Take 40 mg by mouth every evening.    [provider]      Allergies    Codeine, Penicillins, Talwin [pentazocine], Tylox [oxycodone-acetaminophen], and Zithromax [azithromycin]    Review of Systems   Review of Systems  Physical Exam Updated Vital Signs BP 125/80 (BP Location: Right Arm)   Pulse 82   Temp 98.7 F (37.1 C) (Oral)   Resp 17   Ht 5\' 3"  (1.6 m)   Wt 87.5 kg   SpO2 94%   BMI 34.19 kg/m  Physical Exam Vitals and nursing note reviewed.  Constitutional:      General: She is not in acute distress.  Appearance: Normal appearance. She is well-developed. She is not ill-appearing.  HENT:     Head: Normocephalic and atraumatic.     Comments: No Battle sign or raccoon eyes    Right Ear: External ear normal.     Left Ear: External ear normal.     Nose: Nose normal.     Mouth/Throat:     Mouth: Mucous membranes are moist.     Pharynx: Oropharynx is clear.  Eyes:     Extraocular Movements: Extraocular movements intact.     Conjunctiva/sclera: Conjunctivae normal.     Pupils: Pupils are equal, round, and reactive to light.  Neck:     Comments: No C-spine midline tenderness to palpation Cardiovascular:     Rate and Rhythm: Normal rate and regular rhythm.     Pulses: Normal  pulses.     Heart sounds: Normal heart sounds. No murmur heard. Pulmonary:     Effort: Pulmonary effort is normal. No respiratory distress.     Breath sounds: Normal breath sounds.  Abdominal:     General: Abdomen is flat. There is no distension.     Palpations: Abdomen is soft. There is no mass.     Tenderness: There is no abdominal tenderness. There is no guarding.  Musculoskeletal:        General: No deformity. Normal range of motion.     Cervical back: Normal range of motion and neck supple. No rigidity or tenderness.     Right lower leg: No edema.     Left lower leg: No edema.     Comments: No tenderness to palpation of chest wall.  No bruising noted.  No tenderness to palpation of bilateral clavicles.  No tenderness to palpation or deformities noted of bilateral shoulders, elbows, wrists, hips, knees, or ankles.  Skin:    General: Skin is warm and dry.  Neurological:     General: No focal deficit present.     Mental Status: She is alert and oriented to person, place, and time. Mental status is at baseline.     Cranial Nerves: No cranial nerve deficit.     Sensory: No sensory deficit.     Motor: No weakness.  Psychiatric:        Mood and Affect: Mood normal.     ED Results / Procedures / Treatments   Labs (all labs ordered are listed, but only abnormal results are displayed) Labs Reviewed  BASIC METABOLIC PANEL - Abnormal; Notable for the following components:      Result Value   Potassium 3.3 (*)    All other components within normal limits  URINALYSIS, ROUTINE W REFLEX MICROSCOPIC - Abnormal; Notable for the following components:   Leukocytes,Ua TRACE (*)    All other components within normal limits  CBC  CBG MONITORING, ED    EKG EKG Interpretation Date/Time:  Saturday September 01 2023 16:14:51 EDT Ventricular Rate:  88 PR Interval:  156 QRS Duration:  78 QT Interval:  392 QTC Calculation: 474 R Axis:   -13  Text Interpretation: Normal sinus rhythm  Possible Left atrial enlargement Septal infarct , age undetermined Abnormal ECG Confirmed by Vonita Moss [phone]) on 09/01/2023 5:29:33 PM  Radiology No results found.  Procedures Procedures  {Document cardiac monitor, telemetry assessment procedure when appropriate:1}  Medications Ordered in ED Medications  potassium chloride SA (KLOR-CON M) CR tablet 40 mEq (has no administration in time range)    ED Course/ Medical Decision Making/ A&P   {   Click  here for ABCD2, HEART and other calculatorsREFRESH Note before signing :1}                              Medical Decision Making Amount and/or Complexity of Data Reviewed Labs: ordered.  Risk Prescription drug management.   ***Shared decision making regarding disposition.  CT, and IV fluids.  {Document critical care time when appropriate:1} {Document review of labs and clinical decision tools ie heart score, Chads2Vasc2 etc:1}  {Document your independent review of radiology images, and any outside records:1} {Document your discussion with family members, caretakers, and with consultants:1} {Document social determinants of health affecting pt's care:1} {Document your decision making why or why not admission, treatments were needed:1} Final Clinical Impression(s) / ED Diagnoses Final diagnoses:  Syncope and collapse  Hypokalemia    Rx / DC Orders ED Discharge Orders          Ordered    Ambulatory referral to Cardiology        09/01/23 1740

## 2023-09-25 ENCOUNTER — Encounter (HOSPITAL_COMMUNITY): Payer: Self-pay

## 2023-09-25 ENCOUNTER — Emergency Department (HOSPITAL_COMMUNITY)
Admission: EM | Admit: 2023-09-25 | Discharge: 2023-09-26 | Disposition: A | Discharge status: Home / Self Care | Payer: Medicare HMO

## 2023-09-25 ENCOUNTER — Emergency Department (HOSPITAL_COMMUNITY): Payer: Medicare HMO

## 2023-09-25 ENCOUNTER — Other Ambulatory Visit: Payer: Self-pay

## 2023-09-25 DIAGNOSIS — N390 Urinary tract infection, site not specified: Secondary | ICD-10-CM | POA: Diagnosis not present

## 2023-09-25 DIAGNOSIS — R1013 Epigastric pain: Secondary | ICD-10-CM | POA: Diagnosis present

## 2023-09-25 DIAGNOSIS — Z79899 Other long term (current) drug therapy: Secondary | ICD-10-CM | POA: Diagnosis not present

## 2023-09-25 DIAGNOSIS — R112 Nausea with vomiting, unspecified: Secondary | ICD-10-CM | POA: Diagnosis not present

## 2023-09-25 DIAGNOSIS — I10 Essential (primary) hypertension: Secondary | ICD-10-CM | POA: Insufficient documentation

## 2023-09-25 DIAGNOSIS — I2510 Atherosclerotic heart disease of native coronary artery without angina pectoris: Secondary | ICD-10-CM | POA: Diagnosis not present

## 2023-09-25 DIAGNOSIS — R197 Diarrhea, unspecified: Secondary | ICD-10-CM | POA: Diagnosis not present

## 2023-09-25 LAB — COMPREHENSIVE METABOLIC PANEL
ALT: 19 U/L (ref 0–44)
AST: 23 U/L (ref 15–41)
Albumin: 4.5 g/dL (ref 3.5–5.0)
Alkaline Phosphatase: 55 U/L (ref 38–126)
Anion gap: 9 (ref 5–15)
BUN: 11 mg/dL (ref 8–23)
CO2: 29 mmol/L (ref 22–32)
Calcium: 9.9 mg/dL (ref 8.9–10.3)
Chloride: 96 mmol/L — ABNORMAL LOW (ref 98–111)
Creatinine, Ser: 0.99 mg/dL (ref 0.44–1.00)
GFR, Estimated: >60 mL/min (ref >60)
Glucose, Bld: 125 mg/dL — ABNORMAL HIGH (ref 70–99)
Potassium: 3.4 mmol/L — ABNORMAL LOW (ref 3.5–5.1)
Sodium: 134 mmol/L — ABNORMAL LOW (ref 135–145)
Total Bilirubin: 1 mg/dL (ref 0.3–1.2)
Total Protein: 8.5 g/dL — ABNORMAL HIGH (ref 6.5–8.1)

## 2023-09-25 LAB — URINALYSIS, ROUTINE W REFLEX MICROSCOPIC
Bilirubin Urine: NEGATIVE
Glucose, UA: NEGATIVE mg/dL
Hgb urine dipstick: NEGATIVE
Ketones, ur: NEGATIVE mg/dL
Nitrite: NEGATIVE
Protein, ur: NEGATIVE mg/dL
Specific Gravity, Urine: 1.02 (ref 1.005–1.030)
pH: 5 (ref 5.0–8.0)

## 2023-09-25 LAB — CBC
HCT: 46.6 % — ABNORMAL HIGH (ref 36.0–46.0)
Hemoglobin: 15 g/dL (ref 12.0–15.0)
MCH: 28.7 pg (ref 26.0–34.0)
MCHC: 32.2 g/dL (ref 30.0–36.0)
MCV: 89.1 fL (ref 80.0–100.0)
Platelets: 325 10*3/uL (ref 150–400)
RBC: 5.23 MIL/uL — ABNORMAL HIGH (ref 3.87–5.11)
RDW: 14.5 % (ref 11.5–15.5)
WBC: 10.3 10*3/uL (ref 4.0–10.5)
nRBC: 0 % (ref 0.0–0.2)

## 2023-09-25 LAB — LIPASE, BLOOD: Lipase: 25 U/L (ref 11–51)

## 2023-09-25 MED ORDER — ALUM & MAG HYDROXIDE-SIMETH 200-200-20 MG/5ML PO SUSP
30.0000 mL | Freq: Once | ORAL | Status: AC
Start: 2023-09-25 — End: 2023-09-25
  Administered 2023-09-25: 30 mL via ORAL
  Filled 2023-09-25: qty 30

## 2023-09-25 MED ORDER — SODIUM CHLORIDE 0.9 % IV BOLUS
1000.0000 mL | Freq: Once | INTRAVENOUS | Status: AC
  Administered 2023-09-25: 1000 mL via INTRAVENOUS

## 2023-09-25 MED ORDER — ONDANSETRON HCL 4 MG/2ML IJ SOLN
4.0000 mg | Freq: Once | INTRAMUSCULAR | Status: AC
  Administered 2023-09-25: 4 mg via INTRAVENOUS
  Filled 2023-09-25: qty 2

## 2023-09-25 MED ORDER — IOHEXOL 300 MG/ML  SOLN
100.0000 mL | Freq: Once | INTRAMUSCULAR | Status: AC | PRN
  Administered 2023-09-25: 100 mL via INTRAVENOUS

## 2023-09-25 MED ORDER — MORPHINE SULFATE (PF) 2 MG/ML IV SOLN
2.0000 mg | Freq: Once | INTRAVENOUS | Status: AC
  Administered 2023-09-25: 2 mg via INTRAVENOUS
  Filled 2023-09-25: qty 1

## 2023-09-25 NOTE — ED Provider Notes (Signed)
[place] EMERGENCY DEPARTMENT AT [facility] Provider Note   CSN: [id number] Arrival date & time: 09/25/23  1345     History {Add pertinent medical, surgical, social history, OB history to HPI:1} Chief Complaint  Patient presents with   Abdominal Pain    Krystal Gates is a 72 y.o. female.  72 year old female with past medical history of hypertension, hyperlipidemia, and coronary artery disease presenting to the emergency department today with upper abdominal pain.  The patient states that this began over the weekend.  She states that she initially started with nausea, vomiting, and diarrhea.  She thought this was due to a viral infection.  She reports the diarrhea has since resolved.  She is now having persistent vomiting.  She states she is having pain in her upper abdomen.  Reports this mostly in the epigastrium but also in the right upper quadrant.  She called her primary care provider today and was sent to urgent care.  Urgent care referred her to the emergency department for further evaluation.   Abdominal Pain Associated symptoms: nausea and vomiting   Associated symptoms: no fever        Home Medications Prior to Admission medications   Medication Sig Start Date End Date Taking? Authorizing Provider  amitriptyline (ELAVIL) 25 MG tablet Take 25 mg by mouth at bedtime.  01/10/18   [provider]  aspirin EC 81 MG tablet Take 81 mg by mouth every morning.    [provider]  benzonatate (TESSALON) 100 MG capsule Take 1 capsule (100 mg total) by mouth every 8 (eight) hours as needed for cough. 02/04/22   Gustavus Bryant, FNP  cyclobenzaprine (FLEXERIL) 10 MG tablet Take 1 tablet (10 mg total) by mouth 2 (two) times daily as needed for muscle spasms. 04/15/13   Fayrene Helper, PA-C  fluticasone (FLONASE) 50 MCG/ACT nasal spray Place 1 spray into both nostrils daily for 3 days. 02/04/22 02/07/22  Gustavus Bryant, FNP  hydrochlorothiazide (HYDRODIURIL)  25 MG tablet Take 25 mg by mouth daily.    [provider]  ibuprofen (ADVIL,MOTRIN) 200 MG tablet Take 400 mg by mouth every 6 (six) hours as needed (for pain or headaches).    [provider]  lisinopril (PRINIVIL,ZESTRIL) 20 MG tablet Take 20 mg by mouth at bedtime.     [provider]  metoprolol tartrate (LOPRESSOR) 25 MG tablet Take 0.5 tablets (12.5 mg total) by mouth 2 (two) times daily. 01/13/19   Barrett, Joline Salt, PA-C  nitroGLYCERIN (NITROSTAT) 0.4 MG SL tablet Place 1 tablet (0.4 mg total) under the tongue every 5 (five) minutes x 3 doses as needed for chest pain. 01/13/19   Barrett, Joline Salt, PA-C  phenol (CHLORASEPTIC) 1.4 % LIQD Use as directed 1 spray in the mouth or throat as needed for throat irritation / pain. 02/04/22   Gustavus Bryant, FNP  pravastatin (PRAVACHOL) 40 MG tablet Take 40 mg by mouth every evening.    [provider]      Allergies    Codeine, Penicillins, Talwin [pentazocine], Tylox [oxycodone-acetaminophen], and Zithromax [azithromycin]    Review of Systems   Review of Systems  Constitutional:  Negative for fever.  Gastrointestinal:  Positive for abdominal pain, nausea and vomiting.  All other systems reviewed and are negative.   Physical Exam Updated Vital Signs BP (!) 148/79 (BP Location: Left Arm)   Pulse 79   Temp 99.4 F (37.4 C) (Oral)   Resp 18   Ht  5\' 3"  (1.6 m)   Wt 86.2 kg   SpO2 93%   BMI 33.66 kg/m  Physical Exam Vitals and nursing note reviewed.   Gen: NAD Eyes: PERRL, EOMI HEENT: no oropharyngeal swelling Neck: trachea midline Resp: clear to auscultation bilaterally Card: RRR, no murmurs, rubs, or gallops Abd: Tender over the epigastrium and right upper quadrant with guarding to deep palpation Extremities: no calf tenderness, no edema Vascular: 2+ radial pulses bilaterally, 2+ DP pulses bilaterally Skin: no rashes Psyc: acting appropriately   ED Results / Procedures / Treatments    Labs (all labs ordered are listed, but only abnormal results are displayed) Labs Reviewed  COMPREHENSIVE METABOLIC PANEL - Abnormal; Notable for the following components:      Result Value   Sodium 134 (*)    Potassium 3.4 (*)    Chloride 96 (*)    Glucose, Bld 125 (*)    Total Protein 8.5 (*)    All other components within normal limits  CBC - Abnormal; Notable for the following components:   RBC 5.23 (*)    HCT 46.6 (*)    All other components within normal limits  LIPASE, BLOOD  URINALYSIS, ROUTINE W REFLEX MICROSCOPIC    EKG None  Radiology No results found.  Procedures Procedures  {Document cardiac monitor, telemetry assessment procedure when appropriate:1}  Medications Ordered in ED Medications  morphine (PF) 2 MG/ML injection 2 mg (has no administration in time range)  ondansetron (ZOFRAN) injection 4 mg (has no administration in time range)  alum & mag hydroxide-simeth (MAALOX/MYLANTA) 200-200-20 MG/5ML suspension 30 mL (has no administration in time range)  sodium chloride 0.9 % bolus 1,000 mL (has no administration in time range)    ED Course/ Medical Decision Making/ A&P   {   Click here for ABCD2, HEART and other calculatorsREFRESH Note before signing :1}                              Medical Decision Making 71 year old female with past medical history of hypertension hyperlipidemia presenting to the emergency department today with abdominal pain.  I will further evaluate the patient here with basic labs including LFTs and a lipase to evaluate for hepatobiliary pathology or pancreatitis.  Will obtain urinalysis here as well to evaluate for pyelonephritis.  I will obtain an ultrasound of right upper quadrant to evaluate for cholecystitis or cholelithiasis.  I will give her IV fluids as well as morphine and Zofran for her symptoms.  Will also give her GI cocktail in the event that this is due to GERD or gastritis.  Given her age if her initial ultrasound was  unremarkable I will consider CT imaging to evaluate for other etiologies but she certainly is tender in the epigastrium and right upper quadrant primarily at this time.  Amount and/or Complexity of Data Reviewed Labs: ordered. Radiology: ordered.  Risk OTC drugs. Prescription drug management.   ***  {Document critical care time when appropriate:1} {Document review of labs and clinical decision tools ie heart score, Chads2Vasc2 etc:1}  {Document your independent review of radiology images, and any outside records:1} {Document your discussion with family members, caretakers, and with consultants:1} {Document social determinants of health affecting pt's care:1} {Document your decision making why or why not admission, treatments were needed:1} Final Clinical Impression(s) / ED Diagnoses Final diagnoses:  None    Rx / DC Orders ED Discharge Orders     None

## 2023-09-25 NOTE — ED Notes (Signed)
Pt aware urine specimen needed and given urine cup

## 2023-09-25 NOTE — ED Triage Notes (Signed)
PT complaining of abd pain present since Sunday  morning. Pt notes increased discomfort and N/V with eating. Pt referred to the ER by her PCP for further evaluation. Pt did have diarrhea, but no longer present since she has not been eating as much.

## 2023-09-26 MED ORDER — ONDANSETRON 4 MG PO TBDP: 4.0000 mg | ORAL_TABLET | Freq: Three times a day (TID) | ORAL | 0 refills | Status: DC | PRN

## 2023-09-26 MED ORDER — FAMOTIDINE 20 MG PO TABS: 20.0000 mg | ORAL_TABLET | Freq: Two times a day (BID) | ORAL | 0 refills | Status: DC

## 2023-09-26 MED ORDER — HYDROCODONE-ACETAMINOPHEN 5-325 MG PO TABS: 1.0000 | ORAL_TABLET | Freq: Four times a day (QID) | ORAL | 0 refills | Status: DC | PRN

## 2023-09-26 MED ORDER — CEPHALEXIN 500 MG PO CAPS: 500.0000 mg | ORAL_CAPSULE | Freq: Four times a day (QID) | ORAL | 0 refills | Status: DC

## 2023-09-26 MED ORDER — CEPHALEXIN 500 MG PO CAPS
500.0000 mg | ORAL_CAPSULE | Freq: Once | ORAL | Status: AC
  Administered 2023-09-26: 500 mg via ORAL
  Filled 2023-09-26: qty 1

## 2023-09-26 NOTE — Discharge Instructions (Signed)
Your workup today was assuring.  Please take the antibiotic as prescribed.  Take the Pepcid as needed and see if this helps your symptoms.  If is not really helping is okay to stop this.  Take the Norco as needed for pain.  Do not drive drink alcohol taking this as it may make you drowsy.  Take the Zofran for nausea.  Return to the ER for worsening symptoms.

## 2023-10-02 ENCOUNTER — Encounter: Payer: Self-pay | Admitting: Physician Assistant

## 2023-12-20 ENCOUNTER — Ambulatory Visit
Admission: RE | Admit: 2023-12-20 | Discharge: 2023-12-20 | Disposition: A | Discharge status: Home / Self Care | Payer: Medicare HMO | Source: Ambulatory Visit | Attending: Adult Health | Admitting: Adult Health

## 2023-12-20 ENCOUNTER — Other Ambulatory Visit: Payer: Self-pay | Admitting: Adult Health

## 2023-12-20 DIAGNOSIS — M25561 Pain in right knee: Secondary | ICD-10-CM

## 2023-12-26 ENCOUNTER — Ambulatory Visit: Payer: Medicare HMO | Admitting: Physician Assistant

## 2024-01-13 IMAGING — MG MM DIGITAL SCREENING BILAT W/ TOMO AND CAD
8 series · 8 of 24 positions shown · non-contrast
Comparison: Previous exam(s).

CLINICAL DATA: Screening.

EXAM:
DIGITAL SCREENING BILATERAL MAMMOGRAM WITH TOMOSYNTHESIS AND CAD
TECHNIQUE: Bilateral screening digital craniocaudal and mediolateral oblique
mammograms were obtained. Bilateral screening digital breast
tomosynthesis was performed. The images were evaluated with
computer-aided detection.

[L MLO synth-2D]
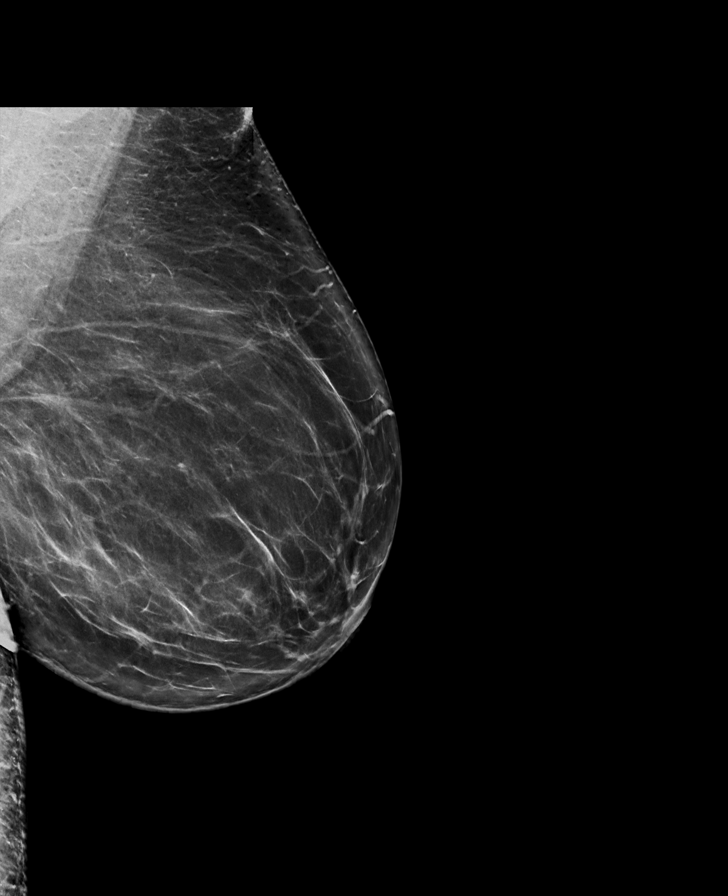

[L CC synth-2D]
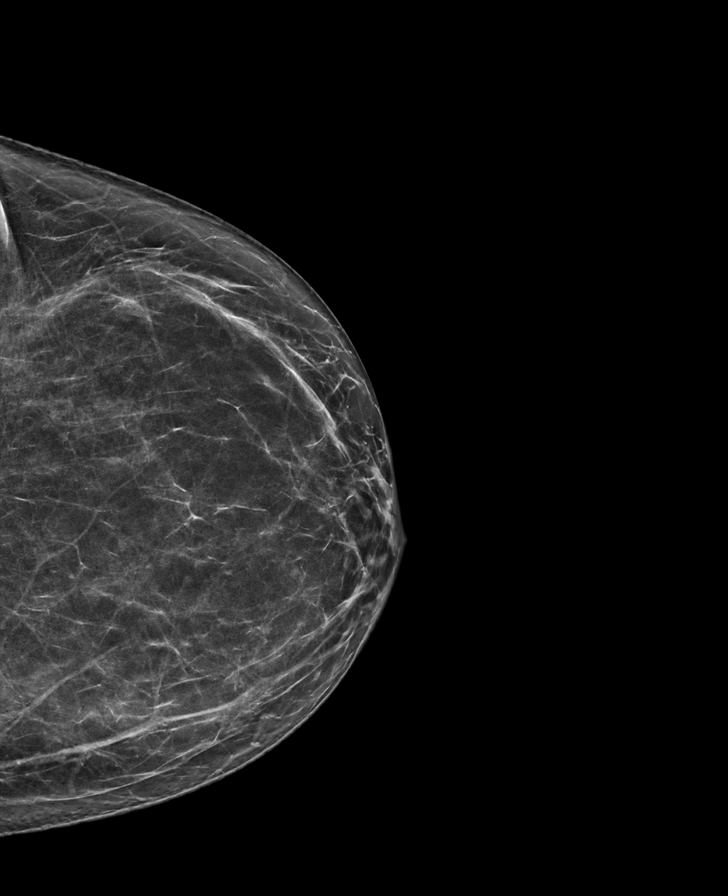

[R MLO synth-2D]
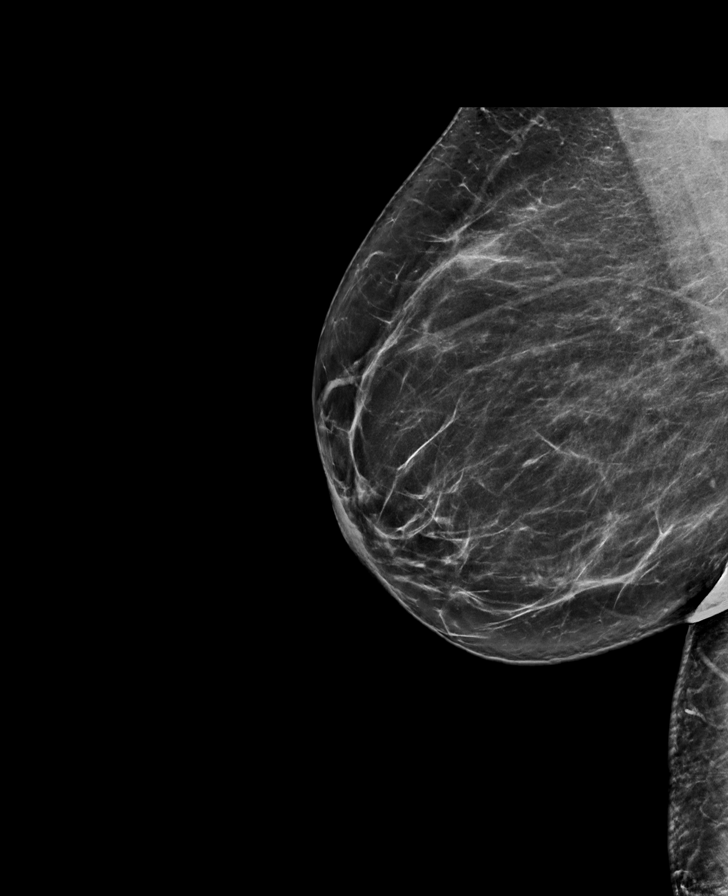

[R CC synth-2D]
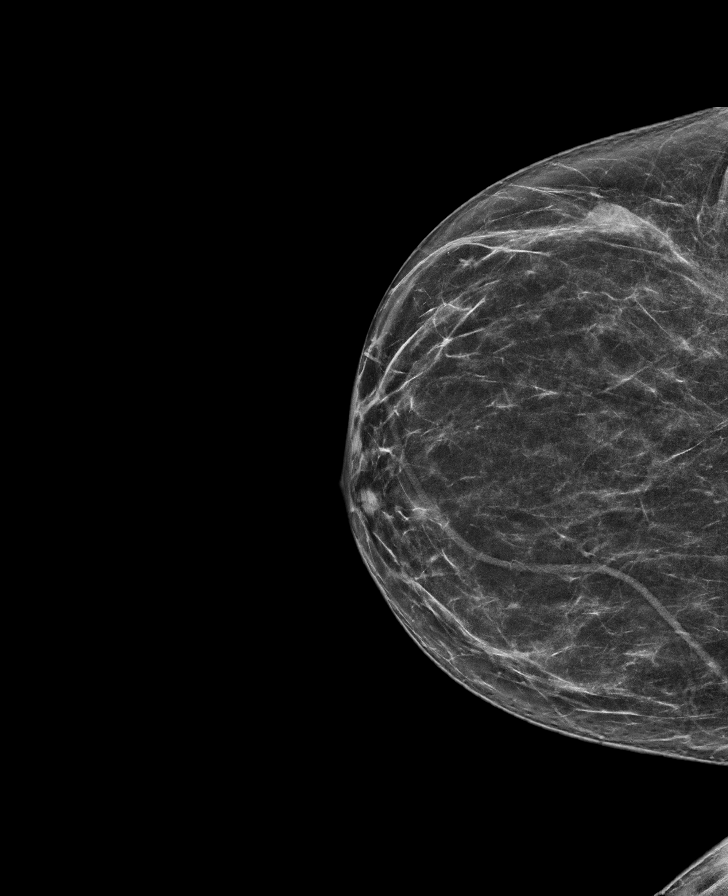

[R MLO tomo · tomo slice 42/83.0]
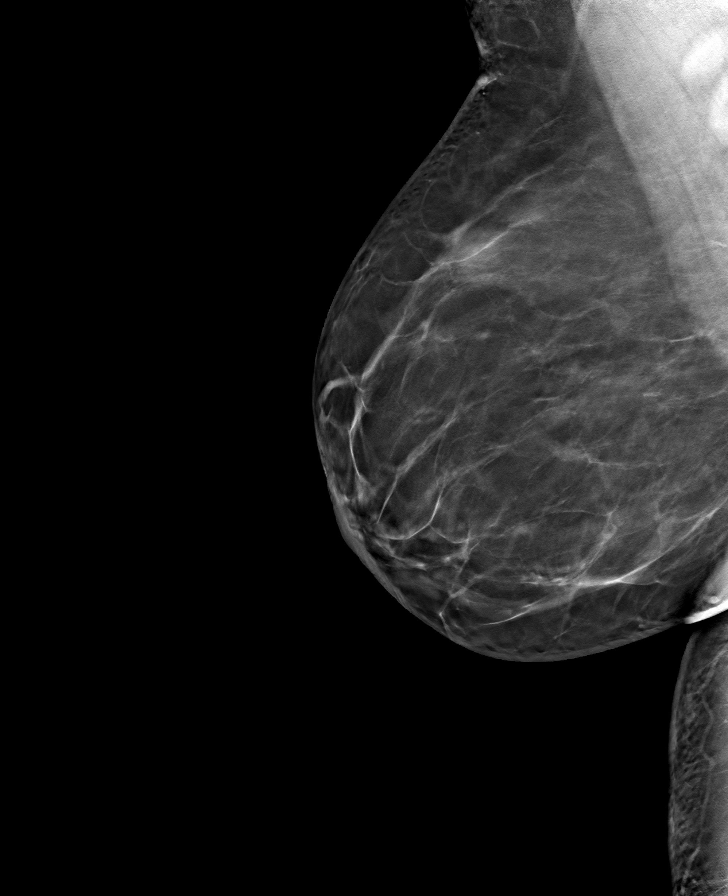

[L MLO tomo · tomo slice 46/91.0]
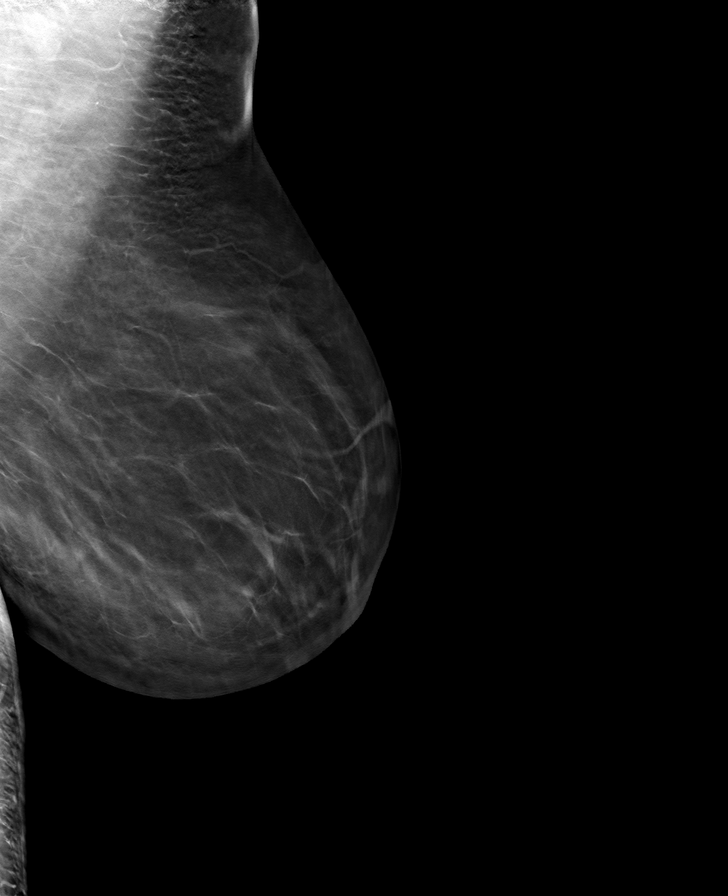

[R CC tomo · tomo slice 35/70.0]
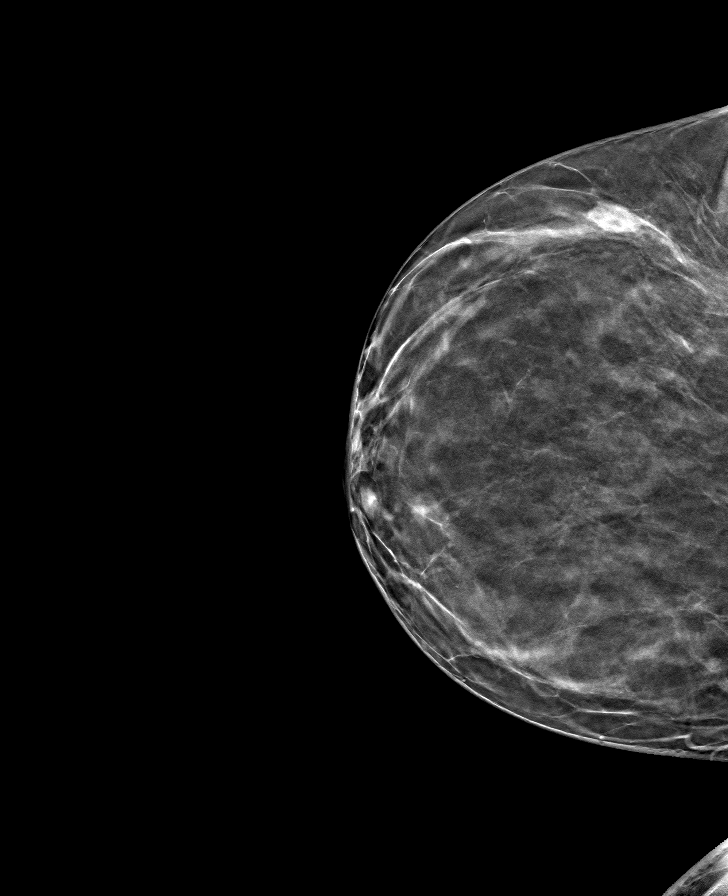

[L CC tomo · tomo slice 35/68.0]
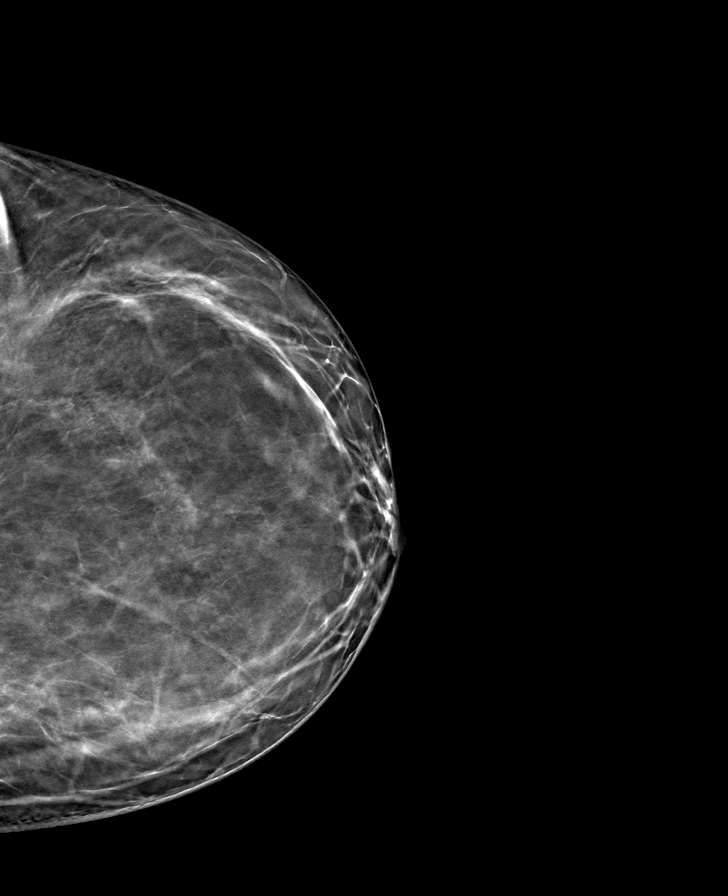

[8 of 24 positions shown; findings below may reference images not displayed]

ACR Breast Density Category b: There are scattered areas of
fibroglandular density.
FINDINGS: There are no findings suspicious for malignancy.
IMPRESSION: No mammographic evidence of malignancy. A result letter of this
screening mammogram will be mailed directly to the patient.

RECOMMENDATION:
Screening mammogram in one year. (Code:51-O-LD2)

BI-RADS CATEGORY  1: Negative.

## 2024-01-29 ENCOUNTER — Encounter: Payer: Self-pay | Admitting: Physician Assistant

## 2024-01-29 ENCOUNTER — Ambulatory Visit: Payer: Medicare HMO | Admitting: Physician Assistant

## 2024-01-29 VITALS — BP 124/62 | HR 74 | Ht 63.0 in | Wt 198.1 lb

## 2024-01-29 DIAGNOSIS — Z1211 Encounter for screening for malignant neoplasm of colon: Secondary | ICD-10-CM | POA: Diagnosis not present

## 2024-01-29 DIAGNOSIS — R1013 Epigastric pain: Secondary | ICD-10-CM

## 2024-01-29 MED ORDER — SUTAB 1479-225-188 MG PO TABS: 24.0000 | ORAL_TABLET | Freq: Once | ORAL | 0 refills | Status: AC

## 2024-01-29 NOTE — Patient Instructions (Signed)
 You have been scheduled for a colonoscopy. Please follow written instructions given to you at your visit today.   If you use inhalers (even only as needed), please bring them with you on the day of your procedure.  DO NOT TAKE 7 DAYS PRIOR TO TEST- Trulicity (dulaglutide) Ozempic, Wegovy (semaglutide) Mounjaro (tirzepatide) Bydureon Bcise (exanatide extended release)  DO NOT TAKE 1 DAY PRIOR TO YOUR TEST Rybelsus (semaglutide) Adlyxin (lixisenatide) Victoza (liraglutide) Byetta (exanatide) ___________________________________________________________________________  Krystal Gates will receive your bowel preparation through [facility], which ensures the lowest copay and home delivery, with outreach via text or call from an [phone] number. Please respond promptly to avoid rescheduling of your procedure. If you are interested in alternative options or have any questions regarding your prep, please contact them at [phone] ____________________________________________________________________________  Your Provider Has Sent Your Bowel Prep Regimen To [facility]   [facility] will contact you to verify your information and collect your copay, if applicable. Enjoy the comfort of your home while your prescription is mailed to you, FREE of any shipping charges.   [facility] accepts all major insurance benefits and applies discounts & coupons.  Have additional questions?   Chat: [email or web address] Call: [phone] Email: [email or web address] NCPDP: [id number]  How will [facility] contact you?  With a Welcome phone call,  a Welcome text and a checkout link in text form.  Texts you receive from [phone] Are NOT Spam.  *To set up delivery, you must complete the checkout process via link or speak to one of the patient care representatives. If [facility] is unable to reach you, your prescription may be delayed.  To avoid long hold times on the phone, you may also utilize the secure chat  feature on the [facility] website to request that they call you back for transaction completion or to expedite your concerns.  [facility] has implemented a new process for scheduling procedures.  Please note your arrival time for the [facility] is your appointment time that is shown on your written instructions.  Please do not arrive one hour prior to the time listed in your instructions.  Please ignore any outside notifications to arrive one hour early.  We apologize for any confusion and look forward to seeing you for your procedure.   _______________________________________________________  If your blood pressure at your visit was 140/90 or greater, please contact your primary care physician to follow up on this.  _______________________________________________________  If you are age 31 or older, your body mass index should be between 23-30. Your Body mass index is 35.1 kg/m. If this is out of the aforementioned range listed, please consider follow up with your Primary Care Provider.  If you are age 67 or younger, your body mass index should be between 19-25. Your Body mass index is 35.1 kg/m. If this is out of the aformentioned range listed, please consider follow up with your Primary Care Provider.   ________________________________________________________  The [facility] providers would like to encourage you to use [facility] to communicate with providers for non-urgent requests or questions.  Due to long hold times on the telephone, sending your provider a message by [facility] may be a faster and more efficient way to get a response.  Please allow 48 business hours for a response.  Please remember that this is for non-urgent requests.  _______________________________________________________

## 2024-01-29 NOTE — Progress Notes (Signed)
 Agree with assessment / plan as outlined.

## 2024-01-29 NOTE — Progress Notes (Signed)
 Chief Complaint: Epigastric pain  HPI:    Krystal Gates is a 73 year old African-American female with a past medical history as listed below, previously known to Dr. Arlyce Dice, who was referred to me by Combs, Prince Solian, * for a complaint of epigastric pain.     04/02/2014 colonoscopy with Dr. Arlyce Dice with a 3 mm sessile polyp in the ascending colon otherwise normal.  Pathology showed benign lymphoid polyp and repeat recommended 10 years.    09/25/2023 CBC normal.  Sodium 134, potassium 3.4, glucose 125.  Otherwise normal.  Lipase normal.    09/25/2023 ReSound right upper quadrant was negative.  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    09/26/2023 CTAP with contrast showed no CT evidence for acute intra-abdominal or pelvic abnormality, hepatic steatosis and aortic atherosclerosis.    Today, patient presents to clinic by her son, and describes back in October she started with epigastric pain and what sounds like some dysphagia after receiving her COVID and Flu vaccine on the same day.  She was seen in the ER and they recommended following up with [place].  Apparently also had some nausea at the time but this is no longer happening.  Currently she actually has not had pain or dysphagia or any nausea or vomiting over the past 3 months.  She just came here to make sure everything is okay and to make her children happy.    Denies fever, chills, weight loss or change in bowel habits.  Past Medical History:  Diagnosis Date   Arthritis    Heart disease    "40% heart blockage, dx 20 years ago"per pt. NO treatment.   Hyperlipidemia    Hypertension    Sciatica     Past Surgical History:  Procedure Laterality Date   ABDOMINAL HYSTERECTOMY     ANAL FISSURE REPAIR     BILATERAL CARPAL TUNNEL RELEASE Bilateral    CARDIAC CATHETERIZATION  2004   Dr Katrinka Blazing, 30-50% LAD, med rx   ECTOPIC PREGNANCY SURGERY      Current Outpatient Medications  Medication Sig Dispense Refill    amitriptyline (ELAVIL) 25 MG tablet Take 25 mg by mouth at bedtime.      aspirin EC 81 MG tablet Take 81 mg by mouth every morning.     benzonatate (TESSALON) 100 MG capsule Take 1 capsule (100 mg total) by mouth every 8 (eight) hours as needed for cough. 21 capsule 0   cephALEXin (KEFLEX) 500 MG capsule Take 1 capsule (500 mg total) by mouth 4 (four) times daily. 28 capsule 0   cyclobenzaprine (FLEXERIL) 10 MG tablet Take 1 tablet (10 mg total) by mouth 2 (two) times daily as needed for muscle spasms. 20 tablet 0   famotidine (PEPCID) 20 MG tablet Take 1 tablet (20 mg total) by mouth 2 (two) times daily. 30 tablet 0   fluticasone (FLONASE) 50 MCG/ACT nasal spray Place 1 spray into both nostrils daily for 3 days. 16 g 0   hydrochlorothiazide (HYDRODIURIL) 25 MG tablet Take 25 mg by mouth daily.     HYDROcodone-acetaminophen (NORCO) 5-325 MG tablet Take 1 tablet by mouth every 6 (six) hours as needed for moderate pain (pain score 4-6). 30 tablet 0   ibuprofen (ADVIL,MOTRIN) 200 MG tablet Take 400 mg by mouth every 6 (six) hours as needed (for pain or headaches).     lisinopril (PRINIVIL,ZESTRIL) 20 MG tablet Take 20 mg by mouth at bedtime.      metoprolol tartrate (LOPRESSOR) 25 MG  tablet Take 0.5 tablets (12.5 mg total) by mouth 2 (two) times daily. 60 tablet 3   nitroGLYCERIN (NITROSTAT) 0.4 MG SL tablet Place 1 tablet (0.4 mg total) under the tongue every 5 (five) minutes x 3 doses as needed for chest pain. 25 tablet 12   ondansetron (ZOFRAN-ODT) 4 MG disintegrating tablet Take 1 tablet (4 mg total) by mouth every 8 (eight) hours as needed for nausea or vomiting. 20 tablet 0   phenol (CHLORASEPTIC) 1.4 % LIQD Use as directed 1 spray in the mouth or throat as needed for throat irritation / pain. 118 mL 0   pravastatin (PRAVACHOL) 40 MG tablet Take 40 mg by mouth every evening.     No current facility-administered medications for this visit.    Allergies as of 01/29/2024 - Review Complete  01/29/2024  Allergen Reaction Noted   Codeine Nausea Only 03/14/2013   Penicillins Hives 03/14/2013   Talwin [pentazocine] Nausea Only 03/14/2013   Tylox [oxycodone-acetaminophen] Nausea Only 03/14/2013   Zithromax [azithromycin] Other (See Comments) 03/14/2013    Family History  Adopted: Yes    Social History   Socioeconomic History   Marital status: Widowed    Spouse name: Not on file   Number of children: Not on file   Years of education: Not on file   Highest education level: Not on file  Occupational History   Not on file  Tobacco Use   Smoking status: Never   Smokeless tobacco: Never  Substance and Sexual Activity   Alcohol use: No   Drug use: No   Sexual activity: Never  Other Topics Concern   Not on file  Social History Narrative   Not on file   Social Drivers of Health   Financial Resource Strain: Not on file  Food Insecurity: Not on file  Transportation Needs: Not on file  Physical Activity: Not on file  Stress: Not on file  Social Connections: Not on file  Intimate Partner Violence: Not on file    Review of Systems:    Constitutional: No weight loss, fever or chills Skin: No rash  Cardiovascular: No chest pain Respiratory: No SOB Gastrointestinal: See HPI and otherwise negative Genitourinary: No dysuria Neurological: No headache, dizziness or syncope Musculoskeletal: No new muscle or joint pain Hematologic: No bleeding Psychiatric: No history of depression or anxiety   Physical Exam:  Vital signs: BP 124/62   Pulse 74   Ht 5\' 3"  (1.6 m)   Wt 198 lb 2 oz (89.9 kg)   SpO2 92%   BMI 35.10 kg/m    Constitutional:   Pleasant overweight AA female appears to be in NAD, Well developed, Well nourished, alert and cooperative Head:  Normocephalic and atraumatic. Eyes:   PEERL, EOMI. No icterus. Conjunctiva pink. Ears:  Normal auditory acuity. Neck:  Supple Throat: Oral cavity and pharynx without inflammation, swelling or lesion.  Respiratory:  Respirations even and unlabored. Lungs clear to auscultation bilaterally.   No wheezes, crackles, or rhonchi.  Cardiovascular: Normal S1, S2. No MRG. Regular rate and rhythm. No peripheral edema, cyanosis or pallor.  Gastrointestinal:  Soft, nondistended, nontender. No rebound or guarding. Normal bowel sounds. No appreciable masses or hepatomegaly. Rectal:  Not performed.  Msk:  Symmetrical without gross deformities. Without edema, no deformity or joint abnormality.  Neurologic:  Alert and  oriented x4;  grossly normal neurologically.  Skin:   Dry and intact without significant lesions or rashes. Psychiatric: Demonstrates good judgement and reason without abnormal affect or behaviors.  RELEVANT LABS AND IMAGING: CBC    Component Value Date/Time   WBC 10.3 09/25/2023 1429   RBC 5.23 (H) 09/25/2023 1429   HGB 15.0 09/25/2023 1429   HCT 46.6 (H) 09/25/2023 1429   PLT 325 09/25/2023 1429   MCV 89.1 09/25/2023 1429   MCH 28.7 09/25/2023 1429   MCHC 32.2 09/25/2023 1429   RDW 14.5 09/25/2023 1429   LYMPHSABS 2.2 01/12/2019 1052   MONOABS 0.7 01/12/2019 1052   EOSABS 0.1 01/12/2019 1052   BASOSABS 0.1 01/12/2019 1052    CMP     Component Value Date/Time   NA 134 (L) 09/25/2023 1429   K 3.4 (L) 09/25/2023 1429   CL 96 (L) 09/25/2023 1429   CO2 29 09/25/2023 1429   GLUCOSE 125 (H) 09/25/2023 1429   BUN 11 09/25/2023 1429   CREATININE 0.99 09/25/2023 1429   CALCIUM 9.9 09/25/2023 1429   PROT 8.5 (H) 09/25/2023 1429   ALBUMIN 4.5 09/25/2023 1429   AST 23 09/25/2023 1429   ALT 19 09/25/2023 1429   ALKPHOS 55 09/25/2023 1429   BILITOT 1.0 09/25/2023 1429   GFRNONAA >60 09/25/2023 1429   GFRAA >60 01/13/2019 0729    Assessment: 1.  Screening for colorectal cancer: Patient's last colonoscopy was in May 2015, she is due in May of this year for repeat 2.  Epigastric pain: Started with some epigastric pain dysphagia symptoms with nausea that lasted for a couple of weeks after getting  both the COVID and flu vaccine on the same day, none over the past 3 to 4 months, did use some NSAIDs during that time due to feeling bad; likely reactive gastritis related to NSAID use at the time  Plan: 1.  Scheduled patient for screening colonoscopy in the [facility] with Dr. Adela Lank.  Did provide the patient a detailed list of risks for the procedure and she agrees to proceed.  This will be scheduled in May when her recall is actually due. Patient is appropriate for endoscopic procedure(s) in the ambulatory ([facility]) setting.  2.  Patient to follow in clinic per recommendations after time of colonoscopy.  Hyacinth Meeker, PA-C [facility] 01/29/2024, 10:19 AM  Cc: Theodis Shove, *

## 2024-03-21 ENCOUNTER — Encounter: Admitting: Gastroenterology

## 2024-04-03 ENCOUNTER — Encounter: Payer: Self-pay | Admitting: Gastroenterology

## 2024-04-03 ENCOUNTER — Ambulatory Visit: Admitting: Gastroenterology

## 2024-04-03 VITALS — BP 177/78 | HR 75 | Temp 97.9°F | Resp 19 | Ht 63.0 in | Wt 198.0 lb

## 2024-04-03 DIAGNOSIS — D123 Benign neoplasm of transverse colon: Secondary | ICD-10-CM

## 2024-04-03 DIAGNOSIS — K5730 Diverticulosis of large intestine without perforation or abscess without bleeding: Secondary | ICD-10-CM

## 2024-04-03 DIAGNOSIS — Z1211 Encounter for screening for malignant neoplasm of colon: Secondary | ICD-10-CM

## 2024-04-03 DIAGNOSIS — K6389 Other specified diseases of intestine: Secondary | ICD-10-CM | POA: Diagnosis not present

## 2024-04-03 DIAGNOSIS — K5520 Angiodysplasia of colon without hemorrhage: Secondary | ICD-10-CM

## 2024-04-03 DIAGNOSIS — D125 Benign neoplasm of sigmoid colon: Secondary | ICD-10-CM

## 2024-04-03 DIAGNOSIS — K648 Other hemorrhoids: Secondary | ICD-10-CM

## 2024-04-03 DIAGNOSIS — K635 Polyp of colon: Secondary | ICD-10-CM

## 2024-04-03 MED ORDER — SODIUM CHLORIDE 0.9 % IV SOLN: 500.0000 mL | Freq: Once | INTRAVENOUS | Status: DC

## 2024-04-03 NOTE — Progress Notes (Signed)
 Pt's states no medical or surgical changes since previsit or office visit.

## 2024-04-03 NOTE — Patient Instructions (Signed)
 Resume previous diet and medications.  Handouts provided on hemorrhoids, polyps, and diverticulosis.  Biopsy results will be sent via MyChart or letter - recommendations will be made on repeat colonoscopy.    YOU HAD AN ENDOSCOPIC PROCEDURE TODAY AT THE [facility]:   Refer to the procedure report that was given to you for any specific questions about what was found during the examination.  If the procedure report does not answer your questions, please call your gastroenterologist to clarify.  If you requested that your care partner not be given the details of your procedure findings, then the procedure report has been included in a sealed envelope for you to review at your convenience later.  YOU SHOULD EXPECT: Some feelings of bloating in the abdomen. Passage of more gas than usual.  Walking can help get rid of the air that was put into your GI tract during the procedure and reduce the bloating. If you had a lower endoscopy (such as a colonoscopy or flexible sigmoidoscopy) you may notice spotting of blood in your stool or on the toilet paper. If you underwent a bowel prep for your procedure, you may not have a normal bowel movement for a few days.  Please Note:  You might notice some irritation and congestion in your nose or some drainage.  This is from the oxygen used during your procedure.  There is no need for concern and it should clear up in a day or so.  SYMPTOMS TO REPORT IMMEDIATELY:  Following lower endoscopy (colonoscopy or flexible sigmoidoscopy):  Excessive amounts of blood in the stool  Significant tenderness or worsening of abdominal pains  Swelling of the abdomen that is new, acute  Fever of 100F or higher  Following upper endoscopy (EGD)  Vomiting of blood or coffee ground material  New chest pain or pain under the shoulder blades  Painful or persistently difficult swallowing  New shortness of breath  Fever of 100F or higher  Black, tarry-looking stools  For  urgent or emergent issues, a gastroenterologist can be reached at any hour by calling [phone]. Do not use MyChart messaging for urgent concerns.    DIET:  We do recommend a small meal at first, but then you may proceed to your regular diet.  Drink plenty of fluids but you should avoid alcoholic beverages for 24 hours.  ACTIVITY:  You should plan to take it easy for the rest of today and you should NOT DRIVE or use heavy machinery until tomorrow (because of the sedation medicines used during the test).    FOLLOW UP: Our staff will call the number listed on your records the next business day following your procedure.  We will call around 7:15- 8:00 am to check on you and address any questions or concerns that you may have regarding the information given to you following your procedure. If we do not reach you, we will leave a message.     If any biopsies were taken you will be contacted by phone or by letter within the next 1-3 weeks.  Please call us  at [phone] if you have not heard about the biopsies in 3 weeks.    SIGNATURES/CONFIDENTIALITY: You and/or your care partner have signed paperwork which will be entered into your electronic medical record.  These signatures attest to the fact that that the information above on your After Visit Summary has been reviewed and is understood.  Full responsibility of the confidentiality of this discharge information lies with you  and/or your care-partner.

## 2024-04-03 NOTE — Progress Notes (Signed)
 Sedate, gd SR, tolerated procedure well, VSS, report to RN

## 2024-04-03 NOTE — Progress Notes (Signed)
 [facility] History and Physical   Primary Care Physician:  Augustus Blood, DO   Reason for Procedure:   Colon cancer screening  Plan:    colonoscopy     HPI: Krystal Gates is a 73 y.o. female  here for colonoscopy screening - last exam 2015, no polyps.   Patient denies any bowel symptoms at this time. No family history of colon cancer known. Otherwise feels well without any cardiopulmonary symptoms.   I have discussed risks / benefits of anesthesia and endoscopic procedure with Mykaela McCoy-Barnes and they wish to proceed with the exams as outlined today.    Past Medical History:  Diagnosis Date   Arthritis    Heart disease    "40% heart blockage, dx 20 years ago"per pt. NO treatment.   Hyperlipidemia    Hypertension    Sciatica     Past Surgical History:  Procedure Laterality Date   ABDOMINAL HYSTERECTOMY     ANAL FISSURE REPAIR     BILATERAL CARPAL TUNNEL RELEASE Bilateral    CARDIAC CATHETERIZATION  2004   Dr Felipe Horton, 30-50% LAD, med rx   ECTOPIC PREGNANCY SURGERY      Prior to Admission medications   Medication Sig Start Date End Date Taking? Authorizing Provider  acetaminophen  (TYLENOL ) 500 MG tablet Take 500 mg by mouth every 4 (four) hours as needed.   Yes [provider]  cyclobenzaprine  (FLEXERIL ) 10 MG tablet Take 1 tablet (10 mg total) by mouth 2 (two) times daily as needed for muscle spasms. 04/15/13  Yes Debbra Fairy, PA-C  hydrochlorothiazide  (HYDRODIURIL ) 25 MG tablet Take 25 mg by mouth daily.   Yes [provider]  IBU 600 MG tablet Take by mouth. 03/18/24  Yes [provider]  lisinopril  (PRINIVIL ,ZESTRIL ) 20 MG tablet Take 20 mg by mouth at bedtime.    Yes [provider]  potassium chloride  (MICRO-K ) 10 MEQ CR capsule Take 10 mEq by mouth daily.   Yes [provider]  pravastatin  (PRAVACHOL ) 40 MG tablet Take 40 mg by mouth every evening.   Yes [provider]  traZODone  (DESYREL) 50 MG tablet Take 50 mg by mouth at bedtime.   Yes [provider]  diclofenac Sodium (VOLTAREN) 1 % GEL Apply 2 g topically as needed. 12/05/23   [provider]  fluticasone  (FLONASE ) 50 MCG/ACT nasal spray Place 1 spray into both nostrils daily for 3 days. 02/04/22 02/07/22  Mound, Haley E, FNP  senna (SENOKOT) 8.6 MG tablet Take 2 tablets by mouth as needed.    [provider]    Current Outpatient Medications  Medication Sig Dispense Refill   acetaminophen  (TYLENOL ) 500 MG tablet Take 500 mg by mouth every 4 (four) hours as needed.     cyclobenzaprine  (FLEXERIL ) 10 MG tablet Take 1 tablet (10 mg total) by mouth 2 (two) times daily as needed for muscle spasms. 20 tablet 0   hydrochlorothiazide  (HYDRODIURIL ) 25 MG tablet Take 25 mg by mouth daily.     IBU 600 MG tablet Take by mouth.     lisinopril  (PRINIVIL ,ZESTRIL ) 20 MG tablet Take 20 mg by mouth at bedtime.      potassium chloride  (MICRO-K ) 10 MEQ CR capsule Take 10 mEq by mouth daily.     pravastatin  (PRAVACHOL ) 40 MG tablet Take 40 mg by mouth every evening.     traZODone (DESYREL) 50 MG tablet Take 50 mg by mouth at bedtime.     diclofenac Sodium (VOLTAREN) 1 % GEL  Apply 2 g topically as needed.     fluticasone  (FLONASE ) 50 MCG/ACT nasal spray Place 1 spray into both nostrils daily for 3 days. 16 g 0   senna (SENOKOT) 8.6 MG tablet Take 2 tablets by mouth as needed.     Current Facility-Administered Medications  Medication Dose Route Frequency Provider Last Rate Last Admin   0.9 %  sodium chloride  infusion  500 mL Intravenous Once Fabricio Endsley, Lendon Queen, MD        Allergies as of 04/03/2024 - Review Complete 04/03/2024  Allergen Reaction Noted   Penicillins Hives 03/14/2013   Codeine Nausea Only 03/14/2013   Talwin [pentazocine] Nausea Only 03/14/2013   Tylox [oxycodone -acetaminophen ] Nausea Only 03/14/2013   Zithromax [azithromycin] Other (See Comments) 03/14/2013    Family History   Adopted: Yes    Social History   Socioeconomic History   Marital status: Widowed    Spouse name: Not on file   Number of children: Not on file   Years of education: Not on file   Highest education level: Not on file  Occupational History   Not on file  Tobacco Use   Smoking status: Never   Smokeless tobacco: Never  Vaping Use   Vaping status: Never Used  Substance and Sexual Activity   Alcohol use: No   Drug use: No   Sexual activity: Never  Other Topics Concern   Not on file  Social History Narrative   Not on file   Social Drivers of Health   Financial Resource Strain: Not on file  Food Insecurity: Not on file  Transportation Needs: Not on file  Physical Activity: Not on file  Stress: Not on file  Social Connections: Not on file  Intimate Partner Violence: Not on file    Review of Systems: All other review of systems negative except as mentioned in the HPI.  Physical Exam: Vital signs BP (!) 161/88   Pulse 65   Temp 97.9 F (36.6 C) (Temporal)   Ht 5\' 3"  (1.6 m)   Wt 198 lb (89.8 kg)   SpO2 100%   BMI 35.07 kg/m   General:   Alert,  Well-developed, pleasant and cooperative in NAD Lungs:  Clear throughout to auscultation.   Heart:  Regular rate and rhythm Abdomen:  Soft, nontender and nondistended.   Neuro/Psych:  Alert and cooperative. Normal mood and affect. A and O x 3  Christi Coward, MD [facility]

## 2024-04-03 NOTE — Progress Notes (Signed)
 Called to room to assist during endoscopic procedure.  Patient ID and intended procedure confirmed with present staff. Received instructions for my participation in the procedure from the performing physician.

## 2024-04-03 NOTE — Op Note (Signed)
 [facility] Patient Name: Krystal Gates Procedure Date: 04/03/2024 10:46 AM MRN: [id number] Endoscopist: Krystal Gates , MD, [id number] Age: 73 Referring MD:  Date of Birth: 12/29/1950 Gender: Female Account #: [id number] Procedure:                Colonoscopy Indications:              Screening for colorectal malignant neoplasm Medicines:                Monitored Anesthesia Care Procedure:                Pre-Anesthesia Assessment:                           - Prior to the procedure, a History and Physical                            was performed, and patient medications and                            allergies were reviewed. The patient's tolerance of                            previous anesthesia was also reviewed. The risks                            and benefits of the procedure and the sedation                            options and risks were discussed with the patient.                            All questions were answered, and informed consent                            was obtained. Prior Anticoagulants: The patient has                            taken no anticoagulant or antiplatelet agents. ASA                            Grade Assessment: II - A patient with mild systemic                            disease. After reviewing the risks and benefits,                            the patient was deemed in satisfactory condition to                            undergo the procedure.                           After obtaining informed consent, the colonoscope  was passed under direct vision. Throughout the                            procedure, the patient's blood pressure, pulse, and                            oxygen saturations were monitored continuously. The                            Olympus CF-HQ190L ([id number]) Colonoscope was                            introduced through the anus and advanced to the the                             cecum, identified by appendiceal orifice and                            ileocecal valve. The colonoscopy was performed                            without difficulty. The patient tolerated the                            procedure well. The quality of the bowel                            preparation was good. The ileocecal valve,                            appendiceal orifice, and rectum were photographed. Scope In: 10:48:58 AM Scope Out: 11:07:41 AM Scope Withdrawal Time: 0 hours 14 minutes 27 seconds  Total Procedure Duration: 0 hours 18 minutes 43 seconds  Findings:                 The perianal and digital rectal examinations were                            normal.                           A single angiodysplastic lesion was found in the                            ascending colon.                           Three sessile polyps were found in the transverse                            colon. The polyps were diminutive in size. These                            polyps were removed with a cold snare. Resection  and retrieval were complete.                           A 3 mm polyp was found in the sigmoid colon. The                            polyp was sessile. The polyp was removed with a                            cold snare. Resection was complete, but the polyp                            tissue was not retrieved (lost in processing).                           A few small-mouthed diverticula were found in the                            sigmoid colon.                           Internal hemorrhoids were found during retroflexion.                           The exam was otherwise without abnormality. Complications:            No immediate complications. Estimated blood loss:                            Minimal. Estimated Blood Loss:     Estimated blood loss was minimal. Impression:               - A single colonic angiodysplastic lesion.                           -  Three diminutive polyps in the transverse colon,                            removed with a cold snare. Resected and retrieved.                           - One 3 mm polyp in the sigmoid colon, removed with                            a cold snare. Complete resection. Polyp tissue not                            retrieved.                           - Diverticulosis in the sigmoid colon.                           - Internal hemorrhoids.                           -  The examination was otherwise normal. Recommendation:           - Patient has a contact number available for                            emergencies. The signs and symptoms of potential                            delayed complications were discussed with the                            patient. Return to normal activities tomorrow.                            Written discharge instructions were provided to the                            patient.                           - Resume previous diet.                           - Continue present medications.                           - Await pathology results. Krystal Pinion P. Krystal Rosano, MD 04/03/2024 11:13:26 AM This report has been signed electronically.

## 2024-04-04 ENCOUNTER — Telehealth: Payer: Self-pay

## 2024-04-04 NOTE — Telephone Encounter (Signed)
Attempted to reach patient for post-procedure f/u call. No answer. Left message for her to please not hesitate to call if she has any questions/concerns regarding her care. 

## 2024-04-08 LAB — SURGICAL PATHOLOGY

## 2024-04-10 ENCOUNTER — Ambulatory Visit: Payer: Self-pay | Admitting: Gastroenterology

## 2024-05-29 ENCOUNTER — Ambulatory Visit: Admitting: Family Medicine

## 2024-05-29 ENCOUNTER — Encounter: Payer: Self-pay | Admitting: Family Medicine

## 2024-05-29 VITALS — BP 126/76 | HR 98 | Temp 98.1°F | Resp 16 | Ht 63.0 in | Wt 192.0 lb

## 2024-05-29 DIAGNOSIS — I10 Essential (primary) hypertension: Secondary | ICD-10-CM | POA: Diagnosis not present

## 2024-05-29 DIAGNOSIS — E785 Hyperlipidemia, unspecified: Secondary | ICD-10-CM | POA: Diagnosis not present

## 2024-05-29 DIAGNOSIS — G4700 Insomnia, unspecified: Secondary | ICD-10-CM | POA: Diagnosis not present

## 2024-05-29 DIAGNOSIS — Z7689 Persons encountering health services in other specified circumstances: Secondary | ICD-10-CM

## 2024-05-29 MED ORDER — HYDROCHLOROTHIAZIDE 25 MG PO TABS: 25.0000 mg | ORAL_TABLET | Freq: Every day | ORAL | 1 refills | Status: DC

## 2024-05-29 MED ORDER — TRAZODONE HCL 100 MG PO TABS: 100.0000 mg | ORAL_TABLET | Freq: Every day | ORAL | 1 refills | Status: DC

## 2024-05-29 MED ORDER — LISINOPRIL 20 MG PO TABS: 20.0000 mg | ORAL_TABLET | Freq: Every day | ORAL | 1 refills | Status: AC

## 2024-05-29 MED ORDER — PRAVASTATIN SODIUM 40 MG PO TABS: 40.0000 mg | ORAL_TABLET | Freq: Every evening | ORAL | 1 refills | Status: DC

## 2024-05-29 MED ORDER — POTASSIUM CHLORIDE ER 10 MEQ PO CPCR: 10.0000 meq | ORAL_CAPSULE | Freq: Every day | ORAL | 1 refills | Status: AC

## 2024-05-29 NOTE — Progress Notes (Signed)
 Patient is here to established care with provider. ~health hx address ~care gaps address

## 2024-05-29 NOTE — Progress Notes (Signed)
 New Patient Office Visit  Subjective    Patient ID: Krystal Gates, female    DOB: [birth year]  Age: 73 y.o. MRN: [id number]  CC:  Chief Complaint  Patient presents with   Establish Care    HPI Krystal Gates presents to establish care and for review of chronic med issues including hypertension. Patient also reports difficulty sleeping. .    Outpatient Encounter Medications as of 05/29/2024  Medication Sig   acetaminophen  (TYLENOL ) 500 MG tablet Take 500 mg by mouth every 4 (four) hours as needed.   cyclobenzaprine  (FLEXERIL ) 10 MG tablet Take 1 tablet (10 mg total) by mouth 2 (two) times daily as needed for muscle spasms.   diclofenac Sodium (VOLTAREN) 1 % GEL Apply 2 g topically as needed.   fluticasone  (FLONASE ) 50 MCG/ACT nasal spray Place 1 spray into both nostrils daily for 3 days.   IBU 600 MG tablet Take by mouth.   senna (SENOKOT) 8.6 MG tablet Take 2 tablets by mouth as needed.   traZODone (DESYREL) 100 MG tablet Take 1 tablet (100 mg total) by mouth at bedtime.   [DISCONTINUED] hydrochlorothiazide  (HYDRODIURIL ) 25 MG tablet Take 25 mg by mouth daily.   [DISCONTINUED] lisinopril  (PRINIVIL ,ZESTRIL ) 20 MG tablet Take 20 mg by mouth at bedtime.    [DISCONTINUED] potassium chloride  (MICRO-K ) 10 MEQ CR capsule Take 10 mEq by mouth daily.   [DISCONTINUED] pravastatin  (PRAVACHOL ) 40 MG tablet Take 40 mg by mouth every evening.   [DISCONTINUED] traZODone (DESYREL) 50 MG tablet Take 50 mg by mouth at bedtime.   hydrochlorothiazide  (HYDRODIURIL ) 25 MG tablet Take 1 tablet (25 mg total) by mouth daily.   lisinopril  (ZESTRIL ) 20 MG tablet Take 1 tablet (20 mg total) by mouth at bedtime.   potassium chloride  (MICRO-K ) 10 MEQ CR capsule Take 1 capsule (10 mEq total) by mouth daily.   pravastatin  (PRAVACHOL ) 40 MG tablet Take 1 tablet (40 mg total) by mouth every evening.   No facility-administered encounter medications on file as of 05/29/2024.    Past Medical History:   Diagnosis Date   Arthritis    Heart disease    40% heart blockage, dx 20 years agoper pt. NO treatment.   Hyperlipidemia    Hypertension    Sciatica     Past Surgical History:  Procedure Laterality Date   ABDOMINAL HYSTERECTOMY     ANAL FISSURE REPAIR     BILATERAL CARPAL TUNNEL RELEASE Bilateral    CARDIAC CATHETERIZATION  2004   Dr Claudene, 30-50% LAD, med rx   ECTOPIC PREGNANCY SURGERY      Family History  Adopted: Yes    Social History   Socioeconomic History   Marital status: Widowed    Spouse name: Not on file   Number of children: Not on file   Years of education: Not on file   Highest education level: Not on file  Occupational History   Not on file  Tobacco Use   Smoking status: Never   Smokeless tobacco: Never  Vaping Use   Vaping status: Never Used  Substance and Sexual Activity   Alcohol use: No   Drug use: No   Sexual activity: Never  Other Topics Concern   Not on file  Social History Narrative   Not on file   Social Drivers of Health   Financial Resource Strain: Medium Risk (05/29/2024)   Overall Financial Resource Strain (CARDIA)    Difficulty of Paying Living Expenses: Somewhat hard  Food Insecurity: No Food Insecurity (05/29/2024)  Hunger Vital Sign    Worried About Running Out of Food in the Last Year: Never true    Ran Out of Food in the Last Year: Never true  Transportation Needs: No Transportation Needs (05/29/2024)   PRAPARE - Administrator, Civil Service (Medical): No    Lack of Transportation (Non-Medical): No  Physical Activity: Inactive (05/29/2024)   Exercise Vital Sign    Days of Exercise per Week: 0 days    Minutes of Exercise per Session: 0 min  Stress: No Stress Concern Present (05/29/2024)   [employer] of Occupational Health - Occupational Stress Questionnaire    Feeling of Stress: Not at all  Social Connections: Unknown (05/29/2024)   Social Connection and Isolation Panel    Frequency of Communication with  Friends and Family: Three times a week    Frequency of Social Gatherings with Friends and Family: Twice a week    Attends Religious Services: 1 to 4 times per year    Active Member of [employer] or Organizations: No    Attends Banker Meetings: 1 to 4 times per year    Marital Status: Not on file  Intimate Partner Violence: Not At Risk (05/29/2024)   Humiliation, Afraid, Rape, and Kick questionnaire    Fear of Current or Ex-Partner: No    Emotionally Abused: No    Physically Abused: No    Sexually Abused: No    Review of Systems  Psychiatric/Behavioral:  The patient has insomnia.   All other systems reviewed and are negative.       Objective   BP 126/76   Pulse 98   Temp 98.1 F (36.7 C) (Oral)   Resp 16   Ht 5' 3 (1.6 m)   Wt 192 lb (87.1 kg)   SpO2 94%   BMI 34.01 kg/m   Physical Exam Vitals and nursing note reviewed.  Constitutional:      General: She is not in acute distress. Cardiovascular:     Rate and Rhythm: Normal rate and regular rhythm.  Pulmonary:     Effort: Pulmonary effort is normal.     Breath sounds: Normal breath sounds.  Abdominal:     Palpations: Abdomen is soft.     Tenderness: There is no abdominal tenderness.  Neurological:     General: No focal deficit present.     Mental Status: She is alert and oriented to person, place, and time.         Assessment & Plan:   1. Essential hypertension (Primary) Appears stable. Continue. Meds refilled.   2. Hyperlipidemia, unspecified hyperlipidemia type Continue. Meds refilled.   3. Insomnia, unspecified type Trazodone increased from 50 to 100mg  .   4. Encounter to establish care   Return in about 3 months (around 08/29/2024) for follow up, physical.   Tanda Raguel SQUIBB, MD

## 2024-08-27 ENCOUNTER — Encounter: Payer: Self-pay | Admitting: Family Medicine

## 2024-08-27 ENCOUNTER — Ambulatory Visit: Admitting: Family Medicine

## 2024-08-27 VITALS — BP 121/77 | HR 79 | Ht 63.0 in | Wt 190.8 lb

## 2024-08-27 DIAGNOSIS — Z1159 Encounter for screening for other viral diseases: Secondary | ICD-10-CM | POA: Diagnosis not present

## 2024-08-27 DIAGNOSIS — Z0000 Encounter for general adult medical examination without abnormal findings: Secondary | ICD-10-CM | POA: Diagnosis not present

## 2024-08-27 DIAGNOSIS — Z136 Encounter for screening for cardiovascular disorders: Secondary | ICD-10-CM | POA: Diagnosis not present

## 2024-08-27 DIAGNOSIS — Z1329 Encounter for screening for other suspected endocrine disorder: Secondary | ICD-10-CM | POA: Diagnosis not present

## 2024-08-27 DIAGNOSIS — Z13228 Encounter for screening for other metabolic disorders: Secondary | ICD-10-CM | POA: Diagnosis not present

## 2024-08-27 DIAGNOSIS — Z130 Encounter for screening for diseases of the blood and blood-forming organs and certain disorders involving the immune mechanism: Secondary | ICD-10-CM

## 2024-08-27 NOTE — Progress Notes (Signed)
 Established Patient Office Visit  Subjective    Patient ID: Krystal Gates, female    DOB: 09/05/1951  Age: 73 y.o. MRN: [id number]  CC:  Chief Complaint  Patient presents with   Annual Exam    HPI Krystal Gates presents for routine annual exam. Patient denies acute complaints.   Outpatient Encounter Medications as of 08/27/2024  Medication Sig   fluticasone  (FLONASE ) 50 MCG/ACT nasal spray Place 1 spray into both nostrils daily for 3 days.   hydrochlorothiazide  (HYDRODIURIL ) 25 MG tablet Take 1 tablet (25 mg total) by mouth daily.   lisinopril  (ZESTRIL ) 20 MG tablet Take 1 tablet (20 mg total) by mouth at bedtime.   potassium chloride  (MICRO-K ) 10 MEQ CR capsule Take 1 capsule (10 mEq total) by mouth daily.   pravastatin  (PRAVACHOL ) 40 MG tablet Take 1 tablet (40 mg total) by mouth every evening.   senna (SENOKOT) 8.6 MG tablet Take 2 tablets by mouth as needed.   traZODone  (DESYREL ) 100 MG tablet Take 1 tablet (100 mg total) by mouth at bedtime.   acetaminophen  (TYLENOL ) 500 MG tablet Take 500 mg by mouth every 4 (four) hours as needed. (Patient not taking: Reported on 08/27/2024)   cyclobenzaprine  (FLEXERIL ) 10 MG tablet Take 1 tablet (10 mg total) by mouth 2 (two) times daily as needed for muscle spasms. (Patient not taking: Reported on 08/27/2024)   diclofenac Sodium (VOLTAREN) 1 % GEL Apply 2 g topically as needed. (Patient not taking: Reported on 08/27/2024)   IBU 600 MG tablet Take by mouth.   No facility-administered encounter medications on file as of 08/27/2024.    Past Medical History:  Diagnosis Date   Arthritis    Heart disease    40% heart blockage, dx 20 years agoper pt. NO treatment.   Hyperlipidemia    Hypertension    Sciatica     Past Surgical History:  Procedure Laterality Date   ABDOMINAL HYSTERECTOMY     ANAL FISSURE REPAIR     BILATERAL CARPAL TUNNEL RELEASE Bilateral    CARDIAC CATHETERIZATION  2004   Dr Claudene, 30-50% LAD, med rx    ECTOPIC PREGNANCY SURGERY      Family History  Adopted: Yes    Social History   Socioeconomic History   Marital status: Widowed    Spouse name: Not on file   Number of children: Not on file   Years of education: Not on file   Highest education level: Not on file  Occupational History   Not on file  Tobacco Use   Smoking status: Never   Smokeless tobacco: Never  Vaping Use   Vaping status: Never Used  Substance and Sexual Activity   Alcohol use: No   Drug use: No   Sexual activity: Never  Other Topics Concern   Not on file  Social History Narrative   Not on file   Social Drivers of Health   Financial Resource Strain: Medium Risk (05/29/2024)   Overall Financial Resource Strain (CARDIA)    Difficulty of Paying Living Expenses: Somewhat hard  Food Insecurity: No Food Insecurity (05/29/2024)   Hunger Vital Sign    Worried About Running Out of Food in the Last Year: Never true    Ran Out of Food in the Last Year: Never true  Transportation Needs: No Transportation Needs (05/29/2024)   PRAPARE - Administrator, Civil Service (Medical): No    Lack of Transportation (Non-Medical): No  Physical Activity: Inactive (05/29/2024)   Exercise  Vital Sign    Days of Exercise per Week: 0 days    Minutes of Exercise per Session: 0 min  Stress: No Stress Concern Present (05/29/2024)   [employer] of Occupational Health - Occupational Stress Questionnaire    Feeling of Stress: Not at all  Social Connections: Unknown (05/29/2024)   Social Connection and Isolation Panel    Frequency of Communication with Friends and Family: Three times a week    Frequency of Social Gatherings with Friends and Family: Twice a week    Attends Religious Services: 1 to 4 times per year    Active Member of [employer] or Organizations: No    Attends Banker Meetings: 1 to 4 times per year    Marital Status: Not on file  Intimate Partner Violence: Not At Risk (05/29/2024)   Humiliation,  Afraid, Rape, and Kick questionnaire    Fear of Current or Ex-Partner: No    Emotionally Abused: No    Physically Abused: No    Sexually Abused: No    Review of Systems  All other systems reviewed and are negative.       Objective    BP 121/77   Pulse 79   Ht 5' 3 (1.6 m)   Wt 190 lb 12.8 oz (86.5 kg)   SpO2 92%   BMI 33.80 kg/m   Physical Exam Vitals and nursing note reviewed.  Constitutional:      General: She is not in acute distress. HENT:     Head: Normocephalic and atraumatic.     Right Ear: Tympanic membrane, ear canal and external ear normal.     Left Ear: Tympanic membrane, ear canal and external ear normal.     Nose: Nose normal.     Mouth/Throat:     Mouth: Mucous membranes are moist.     Pharynx: Oropharynx is clear.  Eyes:     Conjunctiva/sclera: Conjunctivae normal.     Pupils: Pupils are equal, round, and reactive to light.  Neck:     Thyroid: No thyromegaly.  Cardiovascular:     Rate and Rhythm: Normal rate and regular rhythm.     Heart sounds: Normal heart sounds. No murmur heard. Pulmonary:     Effort: Pulmonary effort is normal. No respiratory distress.     Breath sounds: Normal breath sounds.  Abdominal:     General: There is no distension.     Palpations: Abdomen is soft. There is no mass.     Tenderness: There is no abdominal tenderness.  Musculoskeletal:        General: Normal range of motion.     Cervical back: Normal range of motion and neck supple.  Skin:    General: Skin is warm and dry.  Neurological:     General: No focal deficit present.     Mental Status: She is alert and oriented to person, place, and time.  Psychiatric:        Mood and Affect: Mood normal.        Behavior: Behavior normal.         Assessment & Plan:   Annual physical exam -     CMP14+EGFR  Screening for deficiency anemia -     CBC with Differential/Platelet  Encounter for screening for cardiovascular disorders -     Lipid panel  Screening  for endocrine/metabolic/immunity disorders -     VITAMIN D 25 Hydroxy (Vit-D Deficiency, Fractures) -     Hemoglobin A1c -  TSH  Need for hepatitis C screening test -     Hepatitis C antibody     No follow-ups on file.   Tanda Raguel SQUIBB, MD

## 2024-08-28 ENCOUNTER — Ambulatory Visit: Payer: Self-pay | Admitting: Family Medicine

## 2024-08-28 LAB — CBC WITH DIFFERENTIAL/PLATELET
Basophils Absolute: 0.1 x10E3/uL (ref 0.0–0.2)
Basos: 1 %
EOS (ABSOLUTE): 0.3 x10E3/uL (ref 0.0–0.4)
Eos: 4 %
Hematocrit: 42.7 % (ref 34.0–46.6)
Hemoglobin: 13.8 g/dL (ref 11.1–15.9)
Immature Grans (Abs): 0 x10E3/uL (ref 0.0–0.1)
Immature Granulocytes: 0 %
Lymphocytes Absolute: 2.5 x10E3/uL (ref 0.7–3.1)
Lymphs: 34 %
MCH: 29.4 pg (ref 26.6–33.0)
MCHC: 32.3 g/dL (ref 31.5–35.7)
MCV: 91 fL (ref 79–97)
Monocytes Absolute: 0.7 x10E3/uL (ref 0.1–0.9)
Monocytes: 10 %
Neutrophils Absolute: 3.7 x10E3/uL (ref 1.4–7.0)
Neutrophils: 51 %
Platelets: 274 x10E3/uL (ref 150–450)
RBC: 4.7 x10E6/uL (ref 3.77–5.28)
RDW: 14.4 % (ref 11.7–15.4)
WBC: 7.3 x10E3/uL (ref 3.4–10.8)

## 2024-08-28 LAB — LIPID PANEL
Chol/HDL Ratio: 3.2 ratio (ref 0.0–4.4)
Cholesterol, Total: 167 mg/dL (ref 100–199)
HDL: 52 mg/dL (ref >39)
LDL Chol Calc (NIH): 89 mg/dL (ref 0–99)
Triglycerides: 151 mg/dL — ABNORMAL HIGH (ref 0–149)
VLDL Cholesterol Cal: 26 mg/dL (ref 5–40)

## 2024-08-28 LAB — CMP14+EGFR
ALT: 16 IU/L (ref 0–32)
AST: 20 IU/L (ref 0–40)
Albumin: 4.4 g/dL (ref 3.8–4.8)
Alkaline Phosphatase: 66 IU/L (ref 49–135)
BUN/Creatinine Ratio: 12 (ref 12–28)
BUN: 12 mg/dL (ref 8–27)
Bilirubin Total: 0.8 mg/dL (ref 0.0–1.2)
CO2: 23 mmol/L (ref 20–29)
Calcium: 9.8 mg/dL (ref 8.7–10.3)
Chloride: 100 mmol/L (ref 96–106)
Creatinine, Ser: 1.02 mg/dL — ABNORMAL HIGH (ref 0.57–1.00)
Globulin, Total: 3.1 g/dL (ref 1.5–4.5)
Glucose: 103 mg/dL — ABNORMAL HIGH (ref 70–99)
Potassium: 4.5 mmol/L (ref 3.5–5.2)
Sodium: 140 mmol/L (ref 134–144)
Total Protein: 7.5 g/dL (ref 6.0–8.5)
eGFR: 58 mL/min/1.73 — ABNORMAL LOW (ref >59)

## 2024-08-28 LAB — HEMOGLOBIN A1C
Est. average glucose Bld gHb Est-mCnc: 140 mg/dL
Hgb A1c MFr Bld: 6.5 % — ABNORMAL HIGH (ref 4.8–5.6)

## 2024-08-28 LAB — TSH: TSH: 2.97 u[IU]/mL (ref 0.450–4.500)

## 2024-08-28 LAB — HEPATITIS C ANTIBODY: Hep C Virus Ab: NONREACTIVE

## 2024-08-28 LAB — VITAMIN D 25 HYDROXY (VIT D DEFICIENCY, FRACTURES): Vit D, 25-Hydroxy: 25 ng/mL — ABNORMAL LOW (ref 30.0–100.0)

## 2024-09-05 NOTE — Telephone Encounter (Signed)
 Noted thanks

## 2024-09-05 NOTE — Telephone Encounter (Signed)
 Copied from CRM [id number]. Topic: Clinical - Lab/Test Results >> Sep 05, 2024  9:31 AM Avram MATSU wrote: Reason for CRM: I relayed the test results to the pt.

## 2024-09-18 ENCOUNTER — Other Ambulatory Visit: Payer: Self-pay | Admitting: Family Medicine

## 2024-09-18 NOTE — Telephone Encounter (Signed)
 Copied from CRM [id number]. Topic: Clinical - Medication Refill >> Sep 18, 2024  2:01 PM Delon DASEN wrote: Medication: cyclobenzaprine  (FLEXERIL ) 10 MG tablet- original prescriber no longer in practice  Has the patient contacted their pharmacy? No (Agent: If no, request that the patient contact the pharmacy for the refill. If patient does not wish to contact the pharmacy document the reason why and proceed with request.) (Agent: If yes, when and what did the pharmacy advise?)  This is the patient's preferred pharmacy:  [facility] - [place], [place] - [address] [place] [address] Phone: [phone] Fax: [phone]  Is this the correct pharmacy for this prescription? Yes If no, delete pharmacy and type the correct one.   Has the prescription been filled recently? Yes  Is the patient out of the medication? No  Has the patient been seen for an appointment in the last year OR does the patient have an upcoming appointment? Yes  Can we respond through MyChart? Yes  Agent: Please be advised that Rx refills may take up to 3 business days. We ask that you follow-up with your pharmacy.

## 2024-09-19 NOTE — Telephone Encounter (Signed)
 Requested medications are due for refill today.  yes  Requested medications are on the active medications list.  yes  Last refill. 04/15/2013 #20 0 rf  Future visit scheduled.   yes  Notes to clinic.  Refill not delegated. Rx signed by a different provider.    Requested Prescriptions  Pending Prescriptions Disp Refills   cyclobenzaprine  (FLEXERIL ) 10 MG tablet 20 tablet 0    Sig: Take 1 tablet (10 mg total) by mouth 2 (two) times daily as needed for muscle spasms.     Not Delegated - Analgesics:  Muscle Relaxants Failed - 09/19/2024  5:03 PM      Failed - This refill cannot be delegated      Passed - Valid encounter within last 6 months    Recent Outpatient Visits           3 weeks ago Annual physical exam   [place] [facility], MD   3 months ago Essential hypertension   [place] [facility], MD

## 2024-10-22 ENCOUNTER — Other Ambulatory Visit: Payer: Self-pay | Admitting: Family Medicine

## 2024-10-22 NOTE — Telephone Encounter (Unsigned)
 Copied from CRM [id number]. Topic: Clinical - Medication Refill >> Oct 22, 2024 10:10 AM Dedra B wrote: Medication: cyclobenzaprine  (FLEXERIL ) 10 MG tablet Pt's PCP is not the original prescriber. Pt said she discussed getting a refill for the prescription with her PCP at her last appt.  Has the patient contacted their pharmacy? No   This is the patient's preferred pharmacy:  [facility] - [place], [place] - [address] [place] [address] Phone: [phone] Fax: [phone]  Is this the correct pharmacy for this prescription? Yes  Has the prescription been filled recently? No  Is the patient out of the medication? Yes  Has the patient been seen for an appointment in the last year OR does the patient have an upcoming appointment? Yes  Can we respond through MyChart? No, pls call  Agent: Please be advised that Rx refills may take up to 3 business days. We ask that you follow-up with your pharmacy.

## 2024-10-22 NOTE — Telephone Encounter (Signed)
:   cyclobenzaprine  (FLEXERIL ) 10 MG tablet Pt's PCP is not the original prescriber. Pt said she discussed getting a refill for the prescription with her PCP at her last appt.

## 2024-10-27 NOTE — Telephone Encounter (Signed)
 Requested medication (s) are due for refill today: yes  Requested medication (s) are on the active medication list: yes  Last refill:  04/15/13  Future visit scheduled: {Yes  Notes to clinic:  Unable to refill per protocol, cannot delegate.'     Requested Prescriptions  Pending Prescriptions Disp Refills   cyclobenzaprine  (FLEXERIL ) 10 MG tablet 20 tablet 0    Sig: Take 1 tablet (10 mg total) by mouth 2 (two) times daily as needed for muscle spasms.     Not Delegated - Analgesics:  Muscle Relaxants Failed - 10/27/2024 12:54 PM      Failed - This refill cannot be delegated      Passed - Valid encounter within last 6 months    Recent Outpatient Visits           2 months ago Annual physical exam   [place] [facility], MD   5 months ago Essential hypertension   [place] [facility], MD

## 2024-11-01 ENCOUNTER — Other Ambulatory Visit: Payer: Self-pay | Admitting: Family Medicine

## 2024-11-25 ENCOUNTER — Other Ambulatory Visit: Payer: Self-pay | Admitting: Medical Genetics

## 2024-11-25 ENCOUNTER — Other Ambulatory Visit: Payer: Self-pay | Admitting: Family Medicine

## 2024-11-25 NOTE — Telephone Encounter (Unsigned)
 Copied from CRM #[id number]. Topic: Clinical - Medication Refill >> Nov 25, 2024 11:06 AM Alfonso ORN wrote: Medication:  hydrochlorothiazide  (HYDRODIURIL ) 25 MG tablet  Has the patient contacted their pharmacy? Yes  This is the patient's preferred pharmacy:  [facility] - [place], [place] - [address] [place] [address] Phone: [phone] Fax: [phone]  Is this the correct pharmacy for this prescription? Yes If no, delete pharmacy and type the correct one.   Has the prescription been filled recently? No  Is the patient out of the medication? Yes  Has the patient been seen for an appointment in the last year OR does the patient have an upcoming appointment? Yes  Can we respond through MyChart? Yes

## 2024-11-26 MED ORDER — HYDROCHLOROTHIAZIDE 25 MG PO TABS: 25.0000 mg | ORAL_TABLET | Freq: Every day | ORAL | 1 refills | Status: AC

## 2024-11-26 NOTE — Telephone Encounter (Signed)
 Requested Prescriptions  Pending Prescriptions Disp Refills   hydrochlorothiazide  (HYDRODIURIL ) 25 MG tablet 90 tablet 1    Sig: Take 1 tablet (25 mg total) by mouth daily.     Cardiovascular: Diuretics - Thiazide Failed - 11/26/2024  3:43 PM      Failed - Cr in normal range and within 180 days    Creatinine, Ser  Date Value Ref Range Status  08/27/2024 1.02 (H) 0.57 - 1.00 mg/dL Final         Passed - K in normal range and within 180 days    Potassium  Date Value Ref Range Status  08/27/2024 4.5 3.5 - 5.2 mmol/L Final         Passed - Na in normal range and within 180 days    Sodium  Date Value Ref Range Status  08/27/2024 140 134 - 144 mmol/L Final         Passed - Last BP in normal range    BP Readings from Last 1 Encounters:  08/27/24 121/77         Passed - Valid encounter within last 6 months    Recent Outpatient Visits           3 months ago Annual physical exam   [place] [facility], MD   6 months ago Essential hypertension   [place] [facility], MD

## 2024-12-02 ENCOUNTER — Encounter: Payer: Self-pay | Admitting: Family Medicine

## 2024-12-02 ENCOUNTER — Ambulatory Visit (INDEPENDENT_AMBULATORY_CARE_PROVIDER_SITE_OTHER): Admitting: Family Medicine

## 2024-12-02 VITALS — BP 116/54 | HR 66 | Ht 63.0 in | Wt 197.0 lb

## 2024-12-02 DIAGNOSIS — E785 Hyperlipidemia, unspecified: Secondary | ICD-10-CM

## 2024-12-02 DIAGNOSIS — I10 Essential (primary) hypertension: Secondary | ICD-10-CM | POA: Diagnosis not present

## 2024-12-02 DIAGNOSIS — M545 Low back pain, unspecified: Secondary | ICD-10-CM

## 2024-12-02 DIAGNOSIS — G8929 Other chronic pain: Secondary | ICD-10-CM | POA: Diagnosis not present

## 2024-12-02 DIAGNOSIS — R194 Change in bowel habit: Secondary | ICD-10-CM

## 2024-12-02 DIAGNOSIS — R198 Other specified symptoms and signs involving the digestive system and abdomen: Secondary | ICD-10-CM

## 2024-12-02 MED ORDER — CYCLOBENZAPRINE HCL 10 MG PO TABS: 10.0000 mg | ORAL_TABLET | Freq: Two times a day (BID) | ORAL | 0 refills | Status: AC | PRN

## 2024-12-02 NOTE — Progress Notes (Signed)
 "  Established Patient Office Visit  Subjective    Patient ID: Krystal Gates, female    DOB: 1951-04-02  Age: 74 y.o. MRN: [id number]  CC:  Chief Complaint  Patient presents with   Medical Management of Chronic Issues    Pt reports changes in bowel movements for last couple months. Would like to discuss with Dr. Tanda     HPI Krystal Gates presents for follow up of chronic med issues including hypertension. She reports med compliance. Patient reports some significant changes in her stool frequency, consistency, and color.   Outpatient Encounter Medications as of 12/02/2024  Medication Sig   hydrochlorothiazide  (HYDRODIURIL ) 25 MG tablet Take 1 tablet (25 mg total) by mouth daily.   IBU 600 MG tablet Take by mouth.   lisinopril  (ZESTRIL ) 20 MG tablet Take 1 tablet (20 mg total) by mouth at bedtime.   potassium chloride  (MICRO-K ) 10 MEQ CR capsule Take 1 capsule (10 mEq total) by mouth daily.   pravastatin  (PRAVACHOL ) 40 MG tablet Take 1 tablet (40 mg total) by mouth every evening.   senna (SENOKOT) 8.6 MG tablet Take 2 tablets by mouth as needed.   traZODone  (DESYREL ) 100 MG tablet TAKE 1 TABLET AT BEDTIME   acetaminophen  (TYLENOL ) 500 MG tablet Take 500 mg by mouth every 4 (four) hours as needed. (Patient not taking: Reported on 08/27/2024)   cyclobenzaprine  (FLEXERIL ) 10 MG tablet Take 1 tablet (10 mg total) by mouth 2 (two) times daily as needed for muscle spasms. (Patient not taking: Reported on 08/27/2024)   diclofenac Sodium (VOLTAREN) 1 % GEL Apply 2 g topically as needed. (Patient not taking: Reported on 08/27/2024)   fluticasone  (FLONASE ) 50 MCG/ACT nasal spray Place 1 spray into both nostrils daily for 3 days.   No facility-administered encounter medications on file as of 12/02/2024.    Past Medical History:  Diagnosis Date   Arthritis    Heart disease    40% heart blockage, dx 20 years agoper pt. NO treatment.   Hyperlipidemia    Hypertension    Sciatica      Past Surgical History:  Procedure Laterality Date   ABDOMINAL HYSTERECTOMY     ANAL FISSURE REPAIR     BILATERAL CARPAL TUNNEL RELEASE Bilateral    CARDIAC CATHETERIZATION  2004   Dr Claudene, 30-50% LAD, med rx   ECTOPIC PREGNANCY SURGERY      Family History  Adopted: Yes    Social History   Socioeconomic History   Marital status: Widowed    Spouse name: Not on file   Number of children: Not on file   Years of education: Not on file   Highest education level: Not on file  Occupational History   Not on file  Tobacco Use   Smoking status: Never   Smokeless tobacco: Never  Vaping Use   Vaping status: Never Used  Substance and Sexual Activity   Alcohol use: No   Drug use: No   Sexual activity: Never  Other Topics Concern   Not on file  Social History Narrative   Not on file   Social Drivers of Health   Tobacco Use: Low Risk (08/27/2024)   Patient History    Smoking Tobacco Use: Never    Smokeless Tobacco Use: Never    Passive Exposure: Not on file  Financial Resource Strain: Medium Risk (05/29/2024)   Overall Financial Resource Strain (CARDIA)    Difficulty of Paying Living Expenses: Somewhat hard  Food Insecurity: No Food Insecurity (05/29/2024)  Epic    Worried About Programme Researcher, Broadcasting/film/video in the Last Year: Never true    [employer] of Food in the Last Year: Never true  Transportation Needs: No Transportation Needs (05/29/2024)   Epic    Lack of Transportation (Medical): No    Lack of Transportation (Non-Medical): No  Physical Activity: Inactive (05/29/2024)   Exercise Vital Sign    Days of Exercise per Week: 0 days    Minutes of Exercise per Session: 0 min  Stress: No Stress Concern Present (05/29/2024)   [employer] of Occupational Health - Occupational Stress Questionnaire    Feeling of Stress: Not at all  Social Connections: Unknown (05/29/2024)   Social Connection and Isolation Panel    Frequency of Communication with Friends and Family: Three times a week     Frequency of Social Gatherings with Friends and Family: Twice a week    Attends Religious Services: 1 to 4 times per year    Active Member of [employer] or Organizations: No    Attends Banker Meetings: 1 to 4 times per year    Marital Status: Not on file  Intimate Partner Violence: Not At Risk (05/29/2024)   Epic    Fear of Current or Ex-Partner: No    Emotionally Abused: No    Physically Abused: No    Sexually Abused: No  Depression (PHQ2-9): Low Risk (05/29/2024)   Depression (PHQ2-9)    PHQ-2 Score: 0  Alcohol Screen: Low Risk (05/29/2024)   Alcohol Screen    Last Alcohol Screening Score (AUDIT): 0  Housing: Low Risk (05/29/2024)   Epic    Unable to Pay for Housing in the Last Year: No    Number of Times Moved in the Last Year: 0    Homeless in the Last Year: No  Utilities: Not At Risk (05/29/2024)   Epic    Threatened with loss of utilities: No  Health Literacy: Adequate Health Literacy (12/02/2024)   B1300 Health Literacy    Frequency of need for help with medical instructions: Never    Review of Systems  All other systems reviewed and are negative.       Objective    BP (!) 116/54   Pulse 66   Ht 5' 3 (1.6 m)   Wt 197 lb (89.4 kg)   SpO2 94%   BMI 34.90 kg/m   Physical Exam Vitals and nursing note reviewed.  Constitutional:      General: She is not in acute distress. Cardiovascular:     Rate and Rhythm: Normal rate and regular rhythm.  Pulmonary:     Effort: Pulmonary effort is normal.     Breath sounds: Normal breath sounds.  Abdominal:     Palpations: Abdomen is soft.     Tenderness: There is no abdominal tenderness.  Neurological:     General: No focal deficit present.     Mental Status: She is alert and oriented to person, place, and time.         Assessment & Plan:  1. Essential hypertension (Primary) Appears stable. Continue   2. Hyperlipidemia, unspecified hyperlipidemia type Continue  3. Change in bowel movement  -  Ambulatory referral to Gastroenterology  4. Chronic low back pain without sciatica, unspecified back pain laterality Flexeril  prescribed.     No follow-ups on file.   Tanda Raguel SQUIBB, MD  "

## 2024-12-11 ENCOUNTER — Ambulatory Visit

## 2024-12-11 ENCOUNTER — Telehealth: Payer: Self-pay

## 2024-12-11 VITALS — Ht 63.0 in | Wt 197.0 lb

## 2024-12-11 DIAGNOSIS — Z0000 Encounter for general adult medical examination without abnormal findings: Secondary | ICD-10-CM

## 2024-12-11 NOTE — Telephone Encounter (Signed)
 Call to patient GI referral information given

## 2024-12-11 NOTE — Progress Notes (Signed)
 "  Chief Complaint  Patient presents with   Medicare Wellness     Subjective:   Krystal Gates is a 74 y.o. female who presents for a Medicare Annual Wellness Visit.  Visit info / Clinical Intake: Medicare Wellness Visit Type:: Initial Annual Wellness Visit Persons participating in visit and providing information:: patient Medicare Wellness Visit Mode:: Telephone If telephone:: video declined Since this visit was completed virtually, some vitals may be partially provided or unavailable. Missing vitals are due to the limitations of the virtual format.: Documented vitals are patient reported If Telephone or Video please confirm:: I connected with patient using audio/video enable telemedicine. I verified patient identity with two identifiers, discussed telehealth limitations, and patient agreed to proceed. Patient Location:: Home Provider Location:: Office Interpreter Needed?: No Pre-visit prep was completed: yes AWV questionnaire completed by patient prior to visit?: no Living arrangements:: (!) lives alone Patient's Overall Health Status Rating: good Typical amount of pain: none Does pain affect daily life?: no Are you currently prescribed opioids?: no  Dietary Habits and Nutritional Risks How many meals a day?: 2 Eats fruit and vegetables daily?: yes Most meals are obtained by: preparing own meals; eating out In the last 2 weeks, have you had any of the following?: none Diabetic:: no  Functional Status Activities of Daily Living (to include ambulation/medication): Independent Ambulation: Independent with device- listed below Home Assistive Devices/Equipment: Eyeglasses; Cane Medication Administration: Independent Home Management (perform basic housework or laundry): Independent Manage your own finances?: yes Primary transportation is: driving Concerns about vision?: no *vision screening is required for WTM* Concerns about hearing?: (!) yes Uses hearing aids?: no Hear  whispered voice?: yes  Fall Screening Falls in the past year?: 0 Number of falls in past year: 0 Was there an injury with Fall?: 0 Fall Risk Category Calculator: 0 Patient Fall Risk Level: Low Fall Risk  Fall Risk Patient at Risk for Falls Due to: No Fall Risks Fall risk Follow up: Falls evaluation completed; Falls prevention discussed  Home and Transportation Safety: All rugs have non-skid backing?: N/A, no rugs All stairs or steps have railings?: N/A, no stairs Grab bars in the bathtub or shower?: yes Have non-skid surface in bathtub or shower?: yes Good home lighting?: yes Regular seat belt use?: yes Hospital stays in the last year:: no  Cognitive Assessment Difficulty concentrating, remembering, or making decisions? : no Will 6CIT or Mini Cog be Completed: yes What year is it?: 0 points What month is it?: 0 points Give patient an address phrase to remember (5 components): [address] [place], [place] About what time is it?: 0 points Count backwards from 20 to 1: 0 points Say the months of the year in reverse: 0 points Repeat the address phrase from earlier: 0 points 6 CIT Score: 0 points  Advance Directives (For Healthcare) Does Patient Have a Medical Advance Directive?: No Would patient like information on creating a medical advance directive?: No - Patient declined  Reviewed/Updated  Reviewed/Updated: Reviewed All (Medical, Surgical, Family, Medications, Allergies, Care Teams, Patient Goals)    Allergies (verified) Penicillins, Codeine, Talwin [pentazocine], Tylox [oxycodone -acetaminophen ], and Zithromax [azithromycin]   Current Medications (verified) Outpatient Encounter Medications as of 12/11/2024  Medication Sig   acetaminophen  (TYLENOL ) 500 MG tablet Take 500 mg by mouth every 4 (four) hours as needed.   cyclobenzaprine  (FLEXERIL ) 10 MG tablet Take 1 tablet (10 mg total) by mouth 2 (two) times daily as needed for muscle spasms.   diclofenac Sodium  (VOLTAREN) 1 % GEL  Apply 2 g topically as needed.   fluticasone  (FLONASE ) 50 MCG/ACT nasal spray Place 1 spray into both nostrils daily for 3 days.   hydrochlorothiazide  (HYDRODIURIL ) 25 MG tablet Take 1 tablet (25 mg total) by mouth daily.   IBU 600 MG tablet Take by mouth.   lisinopril  (ZESTRIL ) 20 MG tablet Take 1 tablet (20 mg total) by mouth at bedtime.   potassium chloride  (MICRO-K ) 10 MEQ CR capsule Take 1 capsule (10 mEq total) by mouth daily.   pravastatin  (PRAVACHOL ) 40 MG tablet Take 1 tablet (40 mg total) by mouth every evening.   senna (SENOKOT) 8.6 MG tablet Take 2 tablets by mouth as needed.   traZODone  (DESYREL ) 100 MG tablet TAKE 1 TABLET AT BEDTIME   No facility-administered encounter medications on file as of 12/11/2024.    History: Past Medical History:  Diagnosis Date   Arthritis    Heart disease    40% heart blockage, dx 20 years agoper pt. NO treatment.   Hyperlipidemia    Hypertension    Sciatica    Past Surgical History:  Procedure Laterality Date   ABDOMINAL HYSTERECTOMY     ANAL FISSURE REPAIR     BILATERAL CARPAL TUNNEL RELEASE Bilateral    CARDIAC CATHETERIZATION  2004   Dr Claudene, 30-50% LAD, med rx   ECTOPIC PREGNANCY SURGERY     Family History  Adopted: Yes   Social History   Occupational History   Not on file  Tobacco Use   Smoking status: Never   Smokeless tobacco: Never  Vaping Use   Vaping status: Never Used  Substance and Sexual Activity   Alcohol use: No   Drug use: No   Sexual activity: Never   Tobacco Counseling Counseling given: No  SDOH Screenings   Food Insecurity: No Food Insecurity (12/11/2024)  Housing: Unknown (12/11/2024)  Transportation Needs: No Transportation Needs (12/11/2024)  Utilities: Not At Risk (12/11/2024)  Alcohol Screen: Low Risk (05/29/2024)  Depression (PHQ2-9): Low Risk (12/11/2024)  Financial Resource Strain: Medium Risk (05/29/2024)  Physical Activity: Inactive (12/11/2024)  Social Connections:  Moderately Integrated (12/11/2024)  Stress: No Stress Concern Present (12/11/2024)  Tobacco Use: Low Risk (12/11/2024)  Health Literacy: Adequate Health Literacy (12/11/2024)   See flowsheets for full screening details  Depression Screen PHQ 2 & 9 Depression Scale- Over the past 2 weeks, how often have you been bothered by any of the following problems? Little interest or pleasure in doing things: 0 Feeling down, depressed, or hopeless (PHQ Adolescent also includes...irritable): 0 PHQ-2 Total Score: 0 Trouble falling or staying asleep, or sleeping too much: 0 Feeling tired or having little energy: 0 Poor appetite or overeating (PHQ Adolescent also includes...weight loss): 0 Feeling bad about yourself - or that you are a failure or have let yourself or your family down: 0 Trouble concentrating on things, such as reading the newspaper or watching television (PHQ Adolescent also includes...like school work): 0 Moving or speaking so slowly that other people could have noticed. Or the opposite - being so fidgety or restless that you have been moving around a lot more than usual: 0 Thoughts that you would be better off dead, or of hurting yourself in some way: 0 PHQ-9 Total Score: 0 If you checked off any problems, how difficult have these problems made it for you to do your work, take care of things at home, or get along with other people?: Not difficult at all  Depression Treatment Depression Interventions/Treatment : Medication; Currently on Treatment; PHQ2-9  Score <4 Follow-up Not Indicated     Goals Addressed               This Visit's Progress     Patient Stated (pt-stated)        Patient stated she stated she plans to exercise and continue watching her diet             Objective:    Today's Vitals   12/11/24 0816  Weight: 197 lb (89.4 kg)  Height: 5' 3 (1.6 m)   Body mass index is 34.9 kg/m.  Hearing/Vision screen Hearing Screening - Comments:: Denies hearing  difficulties   Vision Screening - Comments:: Wears rx glasses - up to date with routine eye exams with [facility] Immunizations and Health Maintenance Health Maintenance  Topic Date Due   COVID-19 Vaccine (5 - 2025-26 season) 07/28/2024   Mammogram  07/17/2025   Medicare Annual Wellness (AWV)  12/11/2025   Colonoscopy  04/04/2031   Influenza Vaccine  Completed   Bone Density Scan  Completed   Hepatitis C Screening  Completed   Meningococcal B Vaccine  Aged Out   DTaP/Tdap/Td  Discontinued   Pneumococcal Vaccine: 50+ Years  Discontinued   Zoster Vaccines- Shingrix  Discontinued        Assessment/Plan:  This is a routine wellness examination for Marge.  Patient Care Team: Tanda Bleacher, MD as PCP - General (Family Medicine) Okey Vina GAILS, MD as PCP - Cardiology (Cardiology)  I have personally reviewed and noted the following in the patients chart:   Medical and social history Use of alcohol, tobacco or illicit drugs  Current medications and supplements including opioid prescriptions. Functional ability and status Nutritional status Physical activity Advanced directives List of other physicians Hospitalizations, surgeries, and ER visits in previous 12 months Vitals Screenings to include cognitive, depression, and falls Referrals and appointments  No orders of the defined types were placed in this encounter.  In addition, I have reviewed and discussed with patient certain preventive protocols, quality metrics, and best practice recommendations. A written personalized care plan for preventive services as well as general preventive health recommendations were provided to patient.   Verdie CHRISTELLA Saba, CMA   12/11/2024   Return in 1 year (on 12/11/2025).  After Visit Summary: (MyChart) Due to this being a telephonic visit, the after visit summary with patients personalized plan was offered to patient via MyChart   Nurse Notes: Msg forwarded to PCP's nurse to contact  pt regarding a referral to an ENT for difficulty hearing; scheduled 2027 AWV appt. "

## 2024-12-11 NOTE — Telephone Encounter (Signed)
 Would like a call from the nurse with an update on the GI referral due to GI concerns and asking for a referral to ENT due to difficulty hearing.

## 2024-12-11 NOTE — Patient Instructions (Addendum)
 Ms. Suder,  Thank you for taking the time for your Medicare Wellness Visit. I appreciate your continued commitment to your health goals. Please review the care plan we discussed, and feel free to reach out if I can assist you further.  Please note that Annual Wellness Visits do not include a physical exam. Some assessments may be limited, especially if the visit was conducted virtually. If needed, we may recommend an in-person follow-up with your provider.  Ongoing Care Seeing your primary care provider every 3 to 6 months helps us  monitor your health and provide consistent, personalized care.   Referrals If a referral was made during today's visit and you haven't received any updates within two weeks, please contact the referred provider directly to check on the status.  Recommended Screenings:  Health Maintenance  Topic Date Due   Flu Shot  06/27/2024   COVID-19 Vaccine (5 - 2025-26 season) 07/28/2024   Breast Cancer Screening  07/17/2025   Medicare Annual Wellness Visit  12/11/2025   Colon Cancer Screening  04/04/2031   Osteoporosis screening with Bone Density Scan  Completed   Hepatitis C Screening  Completed   Meningitis B Vaccine  Aged Out   DTaP/Tdap/Td vaccine  Discontinued   Pneumococcal Vaccine for age over 90  Discontinued   Zoster (Shingles) Vaccine  Discontinued       12/11/2024    8:18 AM  Advanced Directives  Does Patient Have a Medical Advance Directive? No  Would patient like information on creating a medical advance directive? No - Patient declined    Vision: Annual vision screenings are recommended for early detection of glaucoma, cataracts, and diabetic retinopathy. These exams can also reveal signs of chronic conditions such as diabetes and high blood pressure.  Dental: Annual dental screenings help detect early signs of oral cancer, gum disease, and other conditions linked to overall health, including heart disease and diabetes.

## 2024-12-23 ENCOUNTER — Other Ambulatory Visit: Payer: Self-pay | Admitting: Family Medicine

## 2025-03-03 ENCOUNTER — Other Ambulatory Visit

## 2025-06-01 ENCOUNTER — Ambulatory Visit: Payer: Self-pay | Admitting: Family Medicine

## 2025-12-24 ENCOUNTER — Ambulatory Visit: Payer: Self-pay
